# Patient Record
Sex: Male | Born: 1980 | Race: White | Hispanic: No | Marital: Married | Smoking: Former smoker
Health system: Southern US, Community
[De-identification: ages and names within clinical notes are randomized; demographics above are authoritative.]

## PROBLEM LIST (undated history)

## (undated) DIAGNOSIS — F909 Attention-deficit hyperactivity disorder, unspecified type: Secondary | ICD-10-CM

## (undated) HISTORY — DX: Attention-deficit hyperactivity disorder, unspecified type: F90.9

## (undated) HISTORY — PX: HERNIA REPAIR: SHX51

---

## 2006-09-05 ENCOUNTER — Ambulatory Visit: Payer: Self-pay | Admitting: Internal Medicine

## 2006-09-05 LAB — CONVERTED CEMR LAB
ALT: 21 units/L (ref 0–40)
AST: 25 units/L (ref 0–37)
Albumin: 4.1 g/dL (ref 3.5–5.2)
Alkaline Phosphatase: 47 units/L (ref 39–117)
BUN: 16 mg/dL (ref 6–23)
Basophils Absolute: 0.1 10*3/uL (ref 0.0–0.1)
Basophils Relative: 1 % (ref 0.0–1.0)
Bilirubin, Direct: 0.1 mg/dL (ref 0.0–0.3)
CO2: 31 meq/L (ref 19–32)
Calcium: 9 mg/dL (ref 8.4–10.5)
Chloride: 108 meq/L (ref 96–112)
Cholesterol: 131 mg/dL (ref 0–200)
Creatinine, Ser: 0.8 mg/dL (ref 0.4–1.5)
Eosinophils Absolute: 0.2 10*3/uL (ref 0.0–0.6)
Eosinophils Relative: 4 % (ref 0.0–5.0)
GFR calc Af Amer: 151 mL/min
GFR calc non Af Amer: 125 mL/min
Glucose, Bld: 74 mg/dL (ref 70–99)
HCT: 43.1 % (ref 39.0–52.0)
HDL: 44.5 mg/dL (ref >39.0)
Hemoglobin: 14.9 g/dL (ref 13.0–17.0)
LDL Cholesterol: 66 mg/dL (ref 0–99)
Lymphocytes Relative: 26.4 % (ref 12.0–46.0)
MCHC: 34.6 g/dL (ref 30.0–36.0)
MCV: 87.2 fL (ref 78.0–100.0)
Monocytes Absolute: 0.3 10*3/uL (ref 0.2–0.7)
Monocytes Relative: 5.8 % (ref 3.0–11.0)
Neutro Abs: 3.4 10*3/uL (ref 1.4–7.7)
Neutrophils Relative %: 62.8 % (ref 43.0–77.0)
Platelets: 225 10*3/uL (ref 150–400)
Potassium: 3.6 meq/L (ref 3.5–5.1)
RBC: 4.94 M/uL (ref 4.22–5.81)
RDW: 13.2 % (ref 11.5–14.6)
Sodium: 144 meq/L (ref 135–145)
TSH: 4.41 microintl units/mL (ref 0.35–5.50)
Total Bilirubin: 0.8 mg/dL (ref 0.3–1.2)
Total CHOL/HDL Ratio: 2.9
Total Protein: 6.8 g/dL (ref 6.0–8.3)
Triglycerides: 103 mg/dL (ref 0–149)
VLDL: 21 mg/dL (ref 0–40)
WBC: 5.5 10*3/uL (ref 4.5–10.5)

## 2007-06-21 ENCOUNTER — Encounter: Payer: Self-pay | Admitting: Internal Medicine

## 2007-08-23 ENCOUNTER — Ambulatory Visit: Payer: Self-pay | Admitting: Sports Medicine

## 2007-09-01 ENCOUNTER — Ambulatory Visit: Payer: Self-pay | Admitting: Sports Medicine

## 2007-10-17 ENCOUNTER — Telehealth (INDEPENDENT_AMBULATORY_CARE_PROVIDER_SITE_OTHER): Payer: Self-pay | Admitting: *Deleted

## 2008-05-07 ENCOUNTER — Ambulatory Visit: Payer: Self-pay | Admitting: Sports Medicine

## 2008-06-19 ENCOUNTER — Ambulatory Visit: Payer: Self-pay | Admitting: Sports Medicine

## 2008-08-14 ENCOUNTER — Encounter: Admission: RE | Admit: 2008-08-14 | Discharge: 2008-08-14 | Payer: Self-pay | Admitting: Family Medicine

## 2008-08-14 ENCOUNTER — Ambulatory Visit: Payer: Self-pay | Admitting: Sports Medicine

## 2008-08-15 ENCOUNTER — Encounter (INDEPENDENT_AMBULATORY_CARE_PROVIDER_SITE_OTHER): Payer: Self-pay | Admitting: *Deleted

## 2008-09-03 ENCOUNTER — Telehealth (INDEPENDENT_AMBULATORY_CARE_PROVIDER_SITE_OTHER): Payer: Self-pay | Admitting: *Deleted

## 2008-09-05 ENCOUNTER — Encounter: Payer: Self-pay | Admitting: Sports Medicine

## 2008-09-06 ENCOUNTER — Ambulatory Visit (HOSPITAL_COMMUNITY): Admission: RE | Admit: 2008-09-06 | Discharge: 2008-09-06 | Payer: Self-pay | Admitting: Family Medicine

## 2008-09-09 ENCOUNTER — Ambulatory Visit: Payer: Self-pay | Admitting: Family Medicine

## 2008-09-09 ENCOUNTER — Telehealth: Payer: Self-pay | Admitting: Sports Medicine

## 2008-09-10 ENCOUNTER — Encounter: Payer: Self-pay | Admitting: Family Medicine

## 2008-09-12 LAB — CONVERTED CEMR LAB
ALT: 22 units/L (ref 0–53)
AST: 26 units/L (ref 0–37)
Albumin: 4.1 g/dL (ref 3.5–5.2)
Alkaline Phosphatase: 33 units/L — ABNORMAL LOW (ref 39–117)
BUN: 15 mg/dL (ref 6–23)
Basophils Absolute: 0 10*3/uL (ref 0.0–0.1)
Basophils Relative: 0.3 % (ref 0.0–3.0)
Bilirubin, Direct: 0.1 mg/dL (ref 0.0–0.3)
CO2: 30 meq/L (ref 19–32)
Calcium: 9.6 mg/dL (ref 8.4–10.5)
Chloride: 103 meq/L (ref 96–112)
Creatinine, Ser: 1.1 mg/dL (ref 0.4–1.5)
EBV VCA IgG: 6.38 — ABNORMAL HIGH
EBV VCA IgM: 0.09
Eosinophils Absolute: 0.3 10*3/uL (ref 0.0–0.7)
Eosinophils Relative: 1.8 % (ref 0.0–5.0)
GFR calc non Af Amer: 84.86 mL/min (ref >60)
Glucose, Bld: 87 mg/dL (ref 70–99)
HCT: 42.1 % (ref 39.0–52.0)
Hemoglobin: 14.4 g/dL (ref 13.0–17.0)
Lymphocytes Relative: 12.7 % (ref 12.0–46.0)
Lymphs Abs: 1.8 10*3/uL (ref 0.7–4.0)
MCHC: 34.3 g/dL (ref 30.0–36.0)
MCV: 89.5 fL (ref 78.0–100.0)
Monocytes Absolute: 0.4 10*3/uL (ref 0.1–1.0)
Monocytes Relative: 3 % (ref 3.0–12.0)
Neutro Abs: 11.5 10*3/uL — ABNORMAL HIGH (ref 1.4–7.7)
Neutrophils Relative %: 82.2 % — ABNORMAL HIGH (ref 43.0–77.0)
Platelets: 228 10*3/uL (ref 150.0–400.0)
Potassium: 4.4 meq/L (ref 3.5–5.1)
RBC: 4.7 M/uL (ref 4.22–5.81)
RDW: 12.8 % (ref 11.5–14.6)
Sodium: 141 meq/L (ref 135–145)
TSH: 2.49 microintl units/mL (ref 0.35–5.50)
Total Bilirubin: 0.9 mg/dL (ref 0.3–1.2)
Total Protein: 7.6 g/dL (ref 6.0–8.3)
WBC: 14 10*3/uL — ABNORMAL HIGH (ref 4.5–10.5)

## 2008-10-21 ENCOUNTER — Ambulatory Visit: Payer: Self-pay | Admitting: Internal Medicine

## 2008-10-23 LAB — CONVERTED CEMR LAB
ALT: 29 units/L (ref 0–53)
BUN: 18 mg/dL (ref 6–23)
CO2: 32 meq/L (ref 19–32)
Chloride: 107 meq/L (ref 96–112)
Cholesterol: 136 mg/dL (ref 0–200)
Eosinophils Relative: 5.9 % — ABNORMAL HIGH (ref 0.0–5.0)
Glucose, Bld: 93 mg/dL (ref 70–99)
HCT: 42.4 % (ref 39.0–52.0)
Lymphs Abs: 1.6 10*3/uL (ref 0.7–4.0)
MCV: 89.1 fL (ref 78.0–100.0)
Monocytes Absolute: 0.2 10*3/uL (ref 0.1–1.0)
Platelets: 176 10*3/uL (ref 150.0–400.0)
Potassium: 4.1 meq/L (ref 3.5–5.1)
RDW: 12.9 % (ref 11.5–14.6)
TSH: 4.12 microintl units/mL (ref 0.35–5.50)
Total Bilirubin: 1.1 mg/dL (ref 0.3–1.2)
WBC: 4.2 10*3/uL — ABNORMAL LOW (ref 4.5–10.5)

## 2009-06-10 ENCOUNTER — Ambulatory Visit: Payer: Self-pay | Admitting: Internal Medicine

## 2009-06-10 DIAGNOSIS — F909 Attention-deficit hyperactivity disorder, unspecified type: Secondary | ICD-10-CM | POA: Insufficient documentation

## 2009-07-01 ENCOUNTER — Ambulatory Visit: Payer: Self-pay | Admitting: Psychology

## 2009-07-01 ENCOUNTER — Telehealth: Payer: Self-pay | Admitting: Internal Medicine

## 2009-07-03 ENCOUNTER — Encounter: Payer: Self-pay | Admitting: Internal Medicine

## 2009-07-17 ENCOUNTER — Ambulatory Visit: Payer: Self-pay | Admitting: Psychology

## 2009-07-28 ENCOUNTER — Ambulatory Visit: Payer: Self-pay | Admitting: Psychology

## 2009-07-29 ENCOUNTER — Ambulatory Visit: Payer: Self-pay | Admitting: Internal Medicine

## 2009-08-26 ENCOUNTER — Ambulatory Visit: Payer: Self-pay | Admitting: Internal Medicine

## 2009-09-09 ENCOUNTER — Ambulatory Visit: Payer: Self-pay | Admitting: Internal Medicine

## 2009-09-10 LAB — CONVERTED CEMR LAB
Eosinophils Relative: 3.8 % (ref 0.0–5.0)
H Pylori IgG: NEGATIVE
HCT: 40.8 % (ref 39.0–52.0)
Lymphocytes Relative: 33.4 % (ref 12.0–46.0)
Monocytes Relative: 4.6 % (ref 3.0–12.0)
Neutrophils Relative %: 57.6 % (ref 43.0–77.0)
Platelets: 208 10*3/uL (ref 150.0–400.0)
WBC: 5.2 10*3/uL (ref 4.5–10.5)

## 2009-11-07 ENCOUNTER — Ambulatory Visit: Payer: Self-pay | Admitting: Family Medicine

## 2009-11-07 DIAGNOSIS — E663 Overweight: Secondary | ICD-10-CM | POA: Insufficient documentation

## 2009-11-21 ENCOUNTER — Ambulatory Visit: Payer: Self-pay | Admitting: Internal Medicine

## 2009-11-21 ENCOUNTER — Ambulatory Visit: Payer: Self-pay | Admitting: Family Medicine

## 2009-11-21 DIAGNOSIS — K219 Gastro-esophageal reflux disease without esophagitis: Secondary | ICD-10-CM

## 2009-12-22 ENCOUNTER — Ambulatory Visit: Payer: Self-pay | Admitting: Family Medicine

## 2009-12-22 DIAGNOSIS — F509 Eating disorder, unspecified: Secondary | ICD-10-CM | POA: Insufficient documentation

## 2009-12-24 ENCOUNTER — Ambulatory Visit: Payer: Self-pay | Admitting: Family Medicine

## 2009-12-25 ENCOUNTER — Telehealth: Payer: Self-pay | Admitting: Family Medicine

## 2009-12-25 LAB — CONVERTED CEMR LAB
Basophils Relative: 0.5 % (ref 0.0–3.0)
CO2: 32 meq/L (ref 19–32)
Chloride: 99 meq/L (ref 96–112)
Eosinophils Absolute: 0.1 10*3/uL (ref 0.0–0.7)
Hemoglobin: 15.3 g/dL (ref 13.0–17.0)
MCHC: 34.3 g/dL (ref 30.0–36.0)
MCV: 90.1 fL (ref 78.0–100.0)
Monocytes Absolute: 0.4 10*3/uL (ref 0.1–1.0)
Neutro Abs: 4.8 10*3/uL (ref 1.4–7.7)
Potassium: 4.3 meq/L (ref 3.5–5.1)
RBC: 4.95 M/uL (ref 4.22–5.81)
Sodium: 136 meq/L (ref 135–145)

## 2010-01-16 ENCOUNTER — Encounter: Payer: Self-pay | Admitting: Family Medicine

## 2010-02-13 ENCOUNTER — Encounter: Payer: Self-pay | Admitting: Family Medicine

## 2010-03-17 ENCOUNTER — Ambulatory Visit: Payer: Self-pay | Admitting: Internal Medicine

## 2010-04-15 ENCOUNTER — Encounter: Payer: Self-pay | Admitting: Family Medicine

## 2010-04-24 ENCOUNTER — Encounter: Payer: Self-pay | Admitting: Family Medicine

## 2010-06-17 ENCOUNTER — Telehealth: Payer: Self-pay | Admitting: Internal Medicine

## 2010-07-07 NOTE — Assessment & Plan Note (Signed)
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  Hessie Diener has a hx of binge eating; still identifies self as an Scientist, physiological; attends [employer] 2 X wk.  Pt was obese ages [age].  Usual eating pattern includes 3 meals & 1 snack per day.  Usual intake is  ~same daily, including  ~4 X 16 oz of coffee.  Hessie Diener weighs & measures all foods. Avoided foods include macademia nuts (allergy) & carrots & celery (dislike).  Supplements include quercetin sporadically,  ~6 g fish oil (CVD prevention & ADHD), vit D3 2000 International Units 1-2 X day.  He also takes Tums  ~3 X day (was up to 12 some days when doing NSAIDs) & renitidine, started recently.  Reflux began in Jan when taking a lot of NSAIDs.  Usual exercise includes running, but not  much lately b/c of school & work.  He ran his first marathon in 2009.  He is now running  ~20 min only 1 X wk.  24-hr recall suggests intake of  ~3400 kcal, which was  ~63% carb, 15% protein, & 22% fat: B (7 AM)- 5 oz pineapple, 5 oz banana, 5 oz frozen berries, 4 oz f-f [place] yogurt, 2 c oatmeal; L (11 AM)- salad w/ >1 c beans, 2 T parmesan, 1/4 c mozzarella, 1 oz olive oil, 1 c quinoa & millet, 6 oz beans, 3 oz fish, ditto bkfast fruit; Snk (5 PM)- 1 oz Cheerios, 1 oz raw almonds, 2 oz raisins; D (9 PM)- 1 c whole grain, 2 c raw veg's, 1 c beans, 3 oz firm tofu, 2 T margarine. Yesterday was atypical; dinner is usually larger &  ~4:30 PM.   Nutrition Diagnosis:  Disordered eating pattern (NB-1.5) related to binge eating as evidenced by reported need for extreme control to stay on track.  H/o overwt/obesity ([place]-3.3) evidenced by self-report.  Imbalance of macronutrients (NI-5.5) evidenced by yesterday's intake of 63% carb, 22% fat, and 15% protein.  Physical inactivity  (NB-2.1) related to time constraints evidenced by only workout 1 X wk.   Intervention:  Hessie Diener is doing well w/ current food plan, but would benefit from more variety.  Protein intake is adequate; carb a bit high while not running.  (See Pt Instrxns.)    Monitoring/Eval:  F/U per pt.     Allergies: No Known Drug Allergies   Complete Medication List: 1)  Adderall Xr 10 Mg Xr24h-cap (Amphetamine-dextroamphetamine) .... Take 1 tablet by mouth two times a day 2)  Voltaren 1 % Gel (Diclofenac sodium) .... Apply 2 grams to affected area qid  Other Orders: Inital Assessment Each - [facility] (56213)  Patient Instructions: 1)  Start your day with water, i.e., 8-16 oz.   2)  Reduce coffee intake to 16 oz X 2 per day.   3)  Recommended Dietary Allowance for protein: 0.8 gram per kg body wt.  85 kg X 0.8 g = 68 g protein per day.  ATHLETES probably need more, 1-1.3 g per kg body weight:  85 - 111 g/day.   4)  Continue use of high quality fats (olive and canola oils, unsalted nuts and seeds), and even increase as desired.  (  Yesterday's intake was low in fat relative to carb and protein.) 5)  Reminder:  Hard to eat too many fruits and veg's.   6)  Call Dr. Gerilyn Pilgrim if any Qs:  [phone].

## 2010-07-07 NOTE — Assessment & Plan Note (Signed)
Summary: FU HIP PAIN/MJD   History of Present Illness: Pt returns for follow-up of right sided lower back pain that radiates to his right hip as well as popping in his right hip. He does feel a little better since starting to use the lift in his right shoe while at work. He mainly has the right sided lower back pain when he is at work or after working. He feels comfortable running and denies any pain at that time. Pain is focal over his mid iliac crest and radiates around to his right hip. No significant right knee pain at this time unless he has been standing for long periods of time. Has been doing multiple stretches and exercises and is also trying to slowly change his running form so that he is not as upright and tense when he is running. Continues to run barefoot. Hip rotation exercises though cause hip popping and pain located deep in his groin. No prior injuries to his back or hip.   Allergies: No Known Drug Allergies  Past History:  Past Medical History: Last updated: 09/05/2006 ADD Hx of obesity- highest weight 240lbs  Past Surgical History: Last updated: 09/05/2006 Inguinal herniorrhaphy  Family History: Last updated: 09/05/2006 Father-ht, lipids, recovering alcohol Mother-overweight, htn, lipids Family History of Alcoholism/Addiction Family History High cholesterol Family History Hypertension  Social History: Last updated: 10/21/2008 Occupation: [employer] Married one child Never Smoked Alcohol use-yes Regular exercise-yes (recent hiatus due to recent illness)  Risk Factors: Alcohol Use: <1 (11/21/2009) Exercise: yes (10/21/2008)  Risk Factors: Smoking Status: never (11/21/2009)  Physical Exam  General:  alert and well-developed.   Head:  normocephalic and atraumatic.   Ears:  Normal hearing Mouth:  MMM Neck:  supple and full ROM.   Lungs:  normal respiratory effort.   Msk:  Back: No scoliosis No TTP along the cervical, thoracic or lumbar  spine + TTP over focal site above mid right iliac crest recreating his pain Full ROM at the hip with mild pain with all ROM over focal area just above his mid iliac crest on the right Able to walk on heels and toes 5/5 strength with resisted knee flexion and extension and resisted big toe extension Neg SLR bilaterally 2+ DTRs of bilateral patellar tendons  Hips: No TTP over trochanteric bursas or groin Normal IR and ER of bilateral hips without resistance or crepitus Mildly positive FABER testing bilaterally 5/5 strength with resisted hip flexion and abduction bilaterally Mild pop with flexion and then extension of right hip causing pain Neurovascularly intact  Right Knee No swelling or bony abnormalities Normal ROM but pain laterally with extreme flexion + TTP over ITB at Gerdy's tubercle. No joint line TTP. Normal MCL, LCL, ACL, and PCL with special testing 5/5 strength with resisted knee flexion and extension Neg McMurray's  Left Knee: No swelling or bony abnormalities Full ROM  5/5 strength throughout No TTP throughout Neg special testing    Impression & Recommendations:  Problem # 1:  BACK PAIN, LUMBAR (ICD-724.2) 1. Will refer to Posey Pronto at [facility] for back and hip stretching and strengthening. He may mainly just need modalities to help with his pain symtpoms 2. Can use an OTC NSAID as needed for pain relief 3. Can use heat over his back for 15 minutes followed by ice for 15 minutes fo pain relief 4. Run as tolerated 5. Continue to relax running form with arms lower 6. Spent over half of the 40 minutes couseling the patient  face to face about injuries and treatment plan  Orders: Physical Therapy Referral (PT)  Problem # 2:  HIP PAIN, RIGHT (ICD-719.45) 1. Would consider x-ray in the future to rule out a pincher or CAM hip socket 2. May be a classic snapping hip but it does cause him pain 3. PT  Orders: Physical Therapy Referral (PT)  Problem # 3:   KNEE PAIN, RIGHT (ICD-719.46) 1. Improved since last visit but still having ITB problems - consider ultrasound at PT 2. Do not keep knee flexed at 90 degrees when he is using the foam roller to stretch out his ITB 3. PT  Orders: Physical Therapy Referral (PT)  Complete Medication List: 1)  Adderall Xr 10 Mg Xr24h-cap (Amphetamine-dextroamphetamine) .... Take 1 tablet by mouth two times a day Physical Therapy Referral (PT)   Impression & Recommendations:  Orders: Physical Therapy Referral (PT)   Orders: Physical Therapy Referral (PT)   Orders: Physical Therapy Referral (PT)   Complete Medication List: 1)  Adderall Xr 10 Mg Xr24h-cap (Amphetamine-dextroamphetamine) .... Take 1 tablet by mouth two times a day

## 2010-07-07 NOTE — Miscellaneous (Signed)
Summary: [facility]  [facility]   Imported By: Marily Memos 02/17/2010 09:13:56  _____________________________________________________________________  External Attachment:    Type:   Image     Comment:   External Document

## 2010-07-07 NOTE — Miscellaneous (Signed)
[facility]   Imported By: Marily Memos 02/11/2010 10:13:56  _____________________________________________________________________  External Attachment:    Type:   Image     Comment:   External Document

## 2010-07-07 NOTE — Assessment & Plan Note (Signed)
Summary: 1 month rov/njr   Vital Signs:  Patient profile:   30 year old male Weight:      188 pounds BMI:     24.89 Temp:     98.2 degrees F Pulse rate:   68 / minute Resp:     12 per minute BP sitting:   120 / 78  (left arm)  Vitals Entered By: Gladis Riffle, RN (July 29, 2009 9:54 AM) CC: 1 month rov, states adderall is working Is Patient Diabetic? No   CC:  1 month rov and states adderall is working.  History of Present Illness: ADHD-- started meds--feels better ---says 70% of sxs resolved no bad side effects still distracted at times  All other systems reviewed and were negative   Preventive Screening-Counseling & Management  Alcohol-Tobacco     Alcohol drinks/day: <1     Smoking Status: never  Current Problems (verified): 1)  Adhd  (ICD-314.01) 2)  Preventive Health Care  (ICD-V70.0)  Current Medications (verified): 1)  Adderall Xr 10 Mg Xr24h-Cap (Amphetamine-Dextroamphetamine) .Marland Kitchen.. 1 By Mouth Daily  Allergies (verified): No Known Drug Allergies  Past History:  Past Medical History: Last updated: 09/05/2006 ADD Hx of obesity- highest weight 240lbs  Past Surgical History: Last updated: 09/05/2006 Inguinal herniorrhaphy  Family History: Last updated: 09/05/2006 Father-ht, lipids, recovering alcohol Mother-overweight, htn, lipids Family History of Alcoholism/Addiction Family History High cholesterol Family History Hypertension  Social History: Last updated: 10/21/2008 Occupation: [employer] Married one child Never Smoked Alcohol use-yes Regular exercise-yes (recent hiatus due to recent illness)  Risk Factors: Alcohol Use: <1 (07/29/2009) Exercise: yes (10/21/2008)  Risk Factors: Smoking Status: never (07/29/2009)  Physical Exam  General:  Well-developed,well-nourished,in no acute distress; alert,appropriate and cooperative throughout examination Head:  normocephalic and atraumatic.   Psych:  normally interactive and good eye  contact.     Impression & Recommendations:  Problem # 1:  ADHD (ICD-314.01) discussed-- has sxs at night when the meds "wear off" will try second dose in early afternoon side effects discussed if increase meds work we will provide 90 day worth of Rx next month (no visit necessary)  Complete Medication List: 1)  Adderall Xr 10 Mg Xr24h-cap (Amphetamine-dextroamphetamine) .... Take 1 tablet by mouth two times a day Prescriptions: ADDERALL XR 10 MG XR24H-CAP (AMPHETAMINE-DEXTROAMPHETAMINE) Take 1 tablet by mouth two times a day  #60 x 0   Entered and Authorized by:   Birdie Sons MD   Signed by:   Birdie Sons MD on 07/29/2009   Method used:   Print then Give to Patient   RxID:   [id number]

## 2010-07-07 NOTE — Progress Notes (Signed)
Summary: ADD treatment  Phone Note Call from Patient   Caller: Patient Call For: Birdie Sons MD Summary of Call: Pt. was told by pyschology to call Dr. Cato Mulligan and see how to progress for his ADD treatment. [phone] Initial call taken by: Lynann Beaver CMA,  July 01, 2009 3:38 PM  Follow-up for Phone Call        ok . see meds schedule OV in one month. get records from psychology Follow-up by: Birdie Sons MD,  July 01, 2009 7:08 PM    New/Updated Medications: ADDERALL XR 10 MG XR24H-CAP (AMPHETAMINE-DEXTROAMPHETAMINE) 1 by mouth daily Prescriptions: ADDERALL XR 10 MG XR24H-CAP (AMPHETAMINE-DEXTROAMPHETAMINE) 1 by mouth daily  #30 x 0   Entered by:   Lynann Beaver CMA   Authorized by:   Birdie Sons MD   Signed by:   Lynann Beaver CMA on 07/02/2009   Method used:   Print then Give to Patient   RxID:   [id number] ADDERALL XR 10 MG XR24H-CAP (AMPHETAMINE-DEXTROAMPHETAMINE) 1 by mouth daily  #30 x 0   Entered and Authorized by:   Birdie Sons MD   Signed by:   Birdie Sons MD on 07/01/2009   Method used:   Telephoned to ...         RxID:   [id number]  Left message to have pt pick up script, and make a one month appt.  He has signed release of records from pyschology.

## 2010-07-07 NOTE — Assessment & Plan Note (Signed)
Summary: 1 month rov/fu on meds/njr   Vital Signs:  Patient profile:   30 year old male Weight:      189 pounds Temp:     98.5 degrees F oral BP sitting:   128 / 84  (left arm) Cuff size:   regular  Vitals Entered By: Kern Reap CMA Duncan Dull) (August 26, 2009 9:57 AM) CC: follow-up visit Is Patient Diabetic? No   CC:  follow-up visit.  History of Present Illness: ADHD sxs much better on current dose (adderall two times a day) no bad side effects.   All other systems reviewed and were negative   Current Problems (verified): 1)  Adhd  (ICD-314.01) 2)  Preventive Health Care  (ICD-V70.0)  Allergies: No Known Drug Allergies  Past History:  Past Medical History: Last updated: 09/05/2006 ADD Hx of obesity- highest weight 240lbs  Past Surgical History: Last updated: 09/05/2006 Inguinal herniorrhaphy  Family History: Last updated: 09/05/2006 Father-ht, lipids, recovering alcohol Mother-overweight, htn, lipids Family History of Alcoholism/Addiction Family History High cholesterol Family History Hypertension  Social History: Last updated: 10/21/2008 Occupation: [employer] Married one child Never Smoked Alcohol use-yes Regular exercise-yes (recent hiatus due to recent illness)  Risk Factors: Alcohol Use: <1 (07/29/2009) Exercise: yes (10/21/2008)  Risk Factors: Smoking Status: never (07/29/2009)  Physical Exam  General:  Well-developed,well-nourished,in no acute distress; alert,appropriate and cooperative throughout examination   Impression & Recommendations:  Problem # 1:  ADHD (ICD-314.01) continue current medications  he is tolerating them well without side effects.  Complete Medication List: 1)  Adderall Xr 10 Mg Xr24h-cap (Amphetamine-dextroamphetamine) .... Take 1 tablet by mouth two times a day  Patient Instructions: 1)  Please schedule a follow-up appointment in 3 months. Prescriptions: ADDERALL XR 10 MG XR24H-CAP  (AMPHETAMINE-DEXTROAMPHETAMINE) Take 1 tablet by mouth two times a day  #60 x 0   Entered and Authorized by:   Birdie Sons MD   Signed by:   Birdie Sons MD on 08/26/2009   Method used:   Print then Give to Patient   RxID:   [id number] ADDERALL XR 10 MG XR24H-CAP (AMPHETAMINE-DEXTROAMPHETAMINE) Take 1 tablet by mouth two times a day  #60 x 0   Entered and Authorized by:   Birdie Sons MD   Signed by:   Birdie Sons MD on 08/26/2009   Method used:   Print then Give to Patient   RxID:   [id number] ADDERALL XR 10 MG XR24H-CAP (AMPHETAMINE-DEXTROAMPHETAMINE) Take 1 tablet by mouth two times a day  #60 x 0   Entered and Authorized by:   Birdie Sons MD   Signed by:   Birdie Sons MD on 08/26/2009   Method used:   Print then Give to Patient   RxID:   [id number]

## 2010-07-07 NOTE — Assessment & Plan Note (Signed)
Summary: MED CK // RS   Vital Signs:  Patient profile:   30 year old male Weight:      188 pounds Temp:     98.4 degrees F oral BP sitting:   110 / 78  (left arm) Cuff size:   regular  Vitals Entered By: Kern Reap CMA Duncan Dull) (June 10, 2009 8:34 AM)  Reason for Visit medication for ADD  History of Present Illness: he has wondered about ADHD has gone back to school and has done very well. Has used natural supplements.  has maintained weight loss He takes a fair amount of fish oil (up to 10 grams daily). has noted some GERD as a result  All other systems reviewed and were negative    Current Problems (verified): 1)  Preventive Health Care  (ICD-V70.0)  Current Medications (verified): 1)  Daypro 600 Mg Tabs (Oxaprozin) .... One By Mouth Bid 2)  Voltaren 1 % Gel (Diclofenac Sodium) .... Apply Qid  Allergies (verified): No Known Drug Allergies  Past History:  Past Medical History: Last updated: 09/05/2006 ADD Hx of obesity- highest weight 240lbs  Past Surgical History: Last updated: 09/05/2006 Inguinal herniorrhaphy  Family History: Last updated: 09/05/2006 Father-ht, lipids, recovering alcohol Mother-overweight, htn, lipids Family History of Alcoholism/Addiction Family History High cholesterol Family History Hypertension  Social History: Last updated: 10/21/2008 Occupation: [employer] Married one child Never Smoked Alcohol use-yes Regular exercise-yes (recent hiatus due to recent illness)  Risk Factors: Alcohol Use: <1 (09/05/2006) Exercise: yes (10/21/2008)  Risk Factors: Smoking Status: never (09/05/2006)  Review of Systems       All other systems reviewed and were negative   Physical Exam  General:  Well-developed,well-nourished,in no acute distress; alert,appropriate and cooperative throughout examination Head:  normocephalic and atraumatic.   Eyes:  pupils equal and pupils round.   Ears:  R ear normal and L ear normal.     Neck:  No deformities, masses, or tenderness noted. Chest Wall:  No deformities, masses, tenderness or gynecomastia noted.   Impression & Recommendations:  Problem # 1:  ? of ADHD (ICD-314.01)  sxs are some better with fish oil  Orders: Psychology Referral (Psychology)  Complete Medication List: 1)  Daypro 600 Mg Tabs (Oxaprozin) .... One by mouth bid 2)  Voltaren 1 % Gel (Diclofenac sodium) .... Apply qid

## 2010-07-07 NOTE — Medication Information (Signed)
Summary: Approval for Amphetamine-Dextropamhetamine/[facility]  Approval for Amphetamine-Dextropamhetamine/[facility]   Imported By: Maryln Gottron 07/07/2009 15:18:49  _____________________________________________________________________  External Attachment:    Type:   Image     Comment:   External Document

## 2010-07-07 NOTE — Assessment & Plan Note (Signed)
Summary: HIP FLEXOR PAIN,[facility]   Vital Signs:  Patient profile:   30 year old male Height:      73 inches Weight:      185 pounds BMI:     24.50 BP sitting:   142 / 84  Vitals Entered By: Lillia Pauls CMA (November 07, 2009 10:24 AM)  History of Present Illness: 18 months of worsening pain in Right hip flexor, Right knee. Has seen Chiropracter for hip and low back with some resolution.   Hip pain is lateral and into hi gluteus muscle. Hurts usually afyer he runs although sometimes at end of run. Pain is 4/20, aching.  Does not radiate down back of leg. No leg wekaness or giving way, no bowel or bladder incontinence. Hip feels like it "pops".  Knee pain on right, medial side of knee, 3-4/10, worse after he runs. Sometimes knee pops during flexion, but never gives way, never swells, no warmth or redness.  Runs 3-7 miles a day, 4-6 days a week. Trails when he can, streets other times. Runs barefoot.  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 Also going to [employer] for pre-PT school. Married, child Marvis Moeller is with him today.  Allergies: No Known Drug Allergies  Past History:  Past Medical History: Last updated: 09/05/2006 ADD Hx of obesity- highest weight 240lbs  Past Surgical History: Last updated: 09/05/2006 Inguinal herniorrhaphy  Family History: Last updated: 09/05/2006 Father-ht, lipids, recovering alcohol Mother-overweight, htn, lipids Family History of Alcoholism/Addiction Family History High cholesterol Family History Hypertension  Social History: Last updated: 10/21/2008 Occupation: [employer] Married one child Never Smoked Alcohol use-yes Regular exercise-yes (recent hiatus due to recent illness)  Review of Systems       see hpi  Physical Exam  General:  alert, well-developed, and well-nourished.   Msk:  Normal muscle strength that is symetrical except slightly weaker in right sided hip adductors c/w left.   Leg length discrepancyLeft is  0.5 cm longer than right (true)  Feet without lesion or excessive callous.  GAIT (running) Very upright upper body posture with extremely high arm carry (un-natural apperaing). Fore59foot striker (barefoot). His right leg swings a few inches across midline and he has a bit of cicumduction right foot during beginning of swing phase.  PELVIS: no trendelenburg butthe right PSIS is about 0.5 -1 cm lower than the left. SI joints nin tender   Impression & Recommendations:  Problem # 1:  KNEE PAIN, RIGHT (ICD-719.46)  Problem # 2:  HIP PAIN, RIGHT (ICD-719.45) Long discussion rearding his form. I think because he runs barefoot he i taking pretty short stride length. Combiningthis with the rather excessive upright posture and his mild right leg crossover, he is torking the right hip and knee. We discussed proper form--he says he "allows himself to run natually one day a week". I would encourage lower more natural arm carry, line drills. Some right adductor muscle strengthening. Also a heel lift for shoes when standing. rtc if this does not improve his issues  Problem # 3:  Hx of OVERWEIGHT (ICD-278.02) max weight at one time 240. He had some questions about nitrition. I gave him name of nutritionist  Complete Medication List: 1)  Adderall Xr 10 Mg Xr24h-cap (Amphetamine-dextroamphetamine) .... Take 1 tablet by mouth two times a day

## 2010-07-07 NOTE — Medication Information (Signed)
Summary: Prior Authorization Request for Amphetamine-Dextroamphetamine  Prior Authorization Request for Amphetamine-Dextroamphetamine   Imported By: Maryln Gottron 07/07/2009 14:53:40  _____________________________________________________________________  External Attachment:    Type:   Image     Comment:   External Document

## 2010-07-07 NOTE — Progress Notes (Signed)
Summary: light headed, headache  Phone Note Call from Patient Call back at [phone]   Caller: Patient Reason for Call: Talk to Doctor Summary of Call: patient is calling because he did receive his lab resutls.  However he states that he is still very light headed with a headache.  He was in bed from 4pm-6am.  Any suggestions? Initial call taken by: Kern Reap CMA Duncan Dull),  December 25, 2009 5:00 PM  Follow-up for Phone Call        I suggest that he drink plenty of fluids and get more sleep.  By hx, very inadequate amounts of sleep which could be a big part of his fatigue. Follow-up by: Evelena Peat MD,  December 25, 2009 5:38 PM  Additional Follow-up for Phone Call Additional follow up Details #1::        left message on machine for patient  Additional Follow-up by: Kern Reap CMA Duncan Dull),  December 25, 2009 5:48 PM

## 2010-07-07 NOTE — Letter (Signed)
Summary: PT referral   PT referral   Imported By: Marily Memos 04/15/2010 15:51:08  _____________________________________________________________________  External Attachment:    Type:   Image     Comment:   External Document

## 2010-07-07 NOTE — Assessment & Plan Note (Signed)
Summary: gi issues//ccm   Vital Signs:  Patient profile:   30 year old male Weight:      184 pounds Temp:     97.4 degrees F oral BP sitting:   120 / 90  (left arm) Cuff size:   regular  Vitals Entered By: Kathrynn Speed CMA (December 24, 2009 1:57 PM) CC: Gi issues, tired weak, dizzy, no want to eat, src   History of Present Illness: Patient seen initially with complaints of GI issues but on further questioning his main concerns are feeling more fatigued over the past several weeks. He has a very busy schedule with work, school, and being a father. Very little sleep at night but no recent change in schedule.  He had history of some midepigastric discomfort which improved with Zantac several months ago. He has occasional heartburn symptoms after eating and has recently tried some Pepcid without much relief. Previous Helicobacter testing negative. No nonsteroidal use. Occasional alcohol use. Nonsmoker. No history of peptic ulcer disease. Very regimented diet and very strict with regard to calorie and food intake. No history of bulimia.  Patient denies nausea, vomiting, diarrhea, or recent weight changes. No melena. has recently noticed an increased heart rate. He runs regularly and normally has a heart rate in the 50s or 60s. Has noticed recent elevated pulse around 100 and occasionally up as high as 125.  Denies depressive symptoms. Generally only gets a few hours sleep at night.  Current Medications (verified): 1)  Adderall Xr 10 Mg Xr24h-Cap (Amphetamine-Dextroamphetamine) .... Take 1 Tablet By Mouth Two Times A Day  Allergies (verified): No Known Drug Allergies  Past History:  Past Medical History: Last updated: 09/05/2006 ADD Hx of obesity- highest weight 240lbs  Past Surgical History: Last updated: 09/05/2006 Inguinal herniorrhaphy  Family History: Last updated: 09/05/2006 Father-ht, lipids, recovering alcohol Mother-overweight, htn, lipids Family History of  Alcoholism/Addiction Family History High cholesterol Family History Hypertension  Social History: Last updated: 10/21/2008 Occupation: [employer] Married one child Never Smoked Alcohol use-yes Regular exercise-yes (recent hiatus due to recent illness)  Risk Factors: Alcohol Use: <1 (11/21/2009) Exercise: yes (10/21/2008)  Risk Factors: Smoking Status: never (11/21/2009)  Review of Systems  The patient denies anorexia, weight loss, chest pain, syncope, dyspnea on exertion, prolonged cough, headaches, hemoptysis, melena, and hematochezia.    Physical Exam  General:  Well-developed,well-nourished,in no acute distress; alert,appropriate and cooperative throughout examination Head:  Normocephalic and atraumatic without obvious abnormalities. No apparent alopecia or balding. Mouth:  Oral mucosa and oropharynx without lesions or exudates.  Teeth in good repair. Neck:  No deformities, masses, or tenderness noted. Lungs:  Normal respiratory effort, chest expands symmetrically. Lungs are clear to auscultation, no crackles or wheezes. Heart:  Normal rate and regular rhythm. S1 and S2 normal without gallop, murmur, click, rub or other extra sounds. Abdomen:  Bowel sounds positive,abdomen soft and non-tender without masses, organomegaly or hernias noted. Neurologic:  alert & oriented X3, cranial nerves II-XII intact, and strength normal in all extremities.   Skin:  no rashes.   Cervical Nodes:  No lymphadenopathy noted   Impression & Recommendations:  Problem # 1:  FATIGUE (ICD-780.79) Probably related to inadequate sleep.  Obtain screening labs. Orders: Venipuncture (16109) Specimen Handling (60454) TLB-BMP (Basic Metabolic Panel-BMET) (80048-METABOL) TLB-CBC Platelet - w/Differential (85025-CBCD) TLB-TSH (Thyroid Stimulating Hormone) (84443-TSH)  Problem # 2:  TACHYCARDIA (ICD-785.0)  need to rule out hyperthyroidism though he does not have any weight loss or diarrhea  issues  Orders: Venipuncture (  42706) Specimen Handling (23762) TLB-TSH (Thyroid Stimulating Hormone) (84443-TSH)  Complete Medication List: 1)  Adderall Xr 10 Mg Xr24h-cap (Amphetamine-dextroamphetamine) .... Take 1 tablet by mouth two times a day  Patient Instructions: 1)  Try to get more sleep. 2)  continue exercise as tolerated

## 2010-07-07 NOTE — Assessment & Plan Note (Signed)
Summary: MED CHECK AND REFILLS/CJR   Vital Signs:  Patient profile:   30 year old male Weight:      186 pounds BMI:     24.63 Temp:     99 degrees F oral Pulse rhythm:   regular BP sitting:   120 / 76  Vitals Entered By: Lynann Beaver CMA (March 17, 2010 11:45 AM) CC: rov Is Patient Diabetic? No Pain Assessment Patient in pain? no        CC:  rov.  History of Present Illness: ADHD---doing well on  no significant side effects. He denies tachycardia.  Review of systems denies any chest pain, tachycardia.  Current Medications (verified): 1)  Adderall Xr 10 Mg Xr24h-Cap (Amphetamine-Dextroamphetamine) .... Take 1 Tablet By Mouth Two Times A Day  Allergies (verified): No Known Drug Allergies  Past History:  Past Medical History: Last updated: 09/05/2006 ADD Hx of obesity- highest weight 240lbs  Past Surgical History: Last updated: 09/05/2006 Inguinal herniorrhaphy  Family History: Last updated: 09/05/2006 Father-ht, lipids, recovering alcohol Mother-overweight, htn, lipids Family History of Alcoholism/Addiction Family History High cholesterol Family History Hypertension  Social History: Last updated: 10/21/2008 Occupation: [employer] Married one child Never Smoked Alcohol use-yes Regular exercise-yes (recent hiatus due to recent illness)  Risk Factors: Alcohol Use: <1 (11/21/2009) Exercise: yes (10/21/2008)  Risk Factors: Smoking Status: never (11/21/2009)  Physical Exam  General:  well-developed well-nourished male in no acute distress. Cardiac exam S1-S2 are regular. Abdominal exam thin, active bowel sounds, soft.   Impression & Recommendations:  Problem # 1:  ADHD (ICD-314.01) he is doing well on current medications. Prescriptions given for 3 months. He will call when he needs refills. We will provide for another 3 months I will see him in 6 months.  Complete Medication List: 1)  Adderall Xr 10 Mg Xr24h-cap  (Amphetamine-dextroamphetamine) .... Take 1 tablet by mouth two times a day  Other Orders: Hepatitis B Vaccine ADOLESCENT (2 dose) (16109) Admin 1st Vaccine (60454)  Patient Instructions: 1)  Please schedule a follow-up appointment in 6 months. Prescriptions: ADDERALL XR 10 MG XR24H-CAP (AMPHETAMINE-DEXTROAMPHETAMINE) Take 1 tablet by mouth two times a day  #60 x 0   Entered and Authorized by:   Birdie Sons MD   Signed by:   Birdie Sons MD on 03/17/2010   Method used:   Print then Give to Patient   RxID:   [id number] ADDERALL XR 10 MG XR24H-CAP (AMPHETAMINE-DEXTROAMPHETAMINE) Take 1 tablet by mouth two times a day  #60 x 0   Entered and Authorized by:   Birdie Sons MD   Signed by:   Birdie Sons MD on 03/17/2010   Method used:   Print then Give to Patient   RxID:   [id number] ADDERALL XR 10 MG XR24H-CAP (AMPHETAMINE-DEXTROAMPHETAMINE) Take 1 tablet by mouth two times a day  #60 x 0   Entered and Authorized by:   Birdie Sons MD   Signed by:   Birdie Sons MD on 03/17/2010   Method used:   Print then Give to Patient   RxID:   [id number]    Immunization History:  Hepatitis B Immunization History:    Hepatitis B # 1:  HepB Adult (02/13/2010)  Immunizations Administered:  Hepatitis B Vaccine # 2:    Vaccine Type: HepB Adolescent    Site: left deltoid    Mfr: Merck    Dose: 0.1 ml    Route: IM    Given by: Lynann Beaver CMA    Exp. Date:  10/05/2011    Lot #: [id number]    Immunization History:  Hepatitis B Immunization History:    Hepatitis B # 1:  hepb adult (02/13/2010)  Immunizations Administered:  Hepatitis B Vaccine # 2:    Vaccine Type: HepB Adolescent    Site: left deltoid    Mfr: Merck    Dose: 0.1 ml    Route: IM    Given by: Lynann Beaver CMA    Exp. Date: 10/05/2011    Lot #: [id number]

## 2010-07-07 NOTE — Assessment & Plan Note (Signed)
Summary: 3 prescriptions for adderall given   Vital Signs:  Patient profile:   30 year old male Weight:      186 pounds BMI:     24.63 Temp:     98.3 degrees F oral Pulse rate:   64 / minute Pulse rhythm:   regular Resp:     12 per minute BP sitting:   102 / 64  (left arm) Cuff size:   regular  Vitals Entered By: Gladis Riffle, RN (November 21, 2009 9:58 AM) CC: 3 month rov--medication seems to be working Is Patient Diabetic? No   CC:  3 month rov--medication seems to be working.  History of Present Illness: Feels well  reviewed [facility]  office note---has take some ibuprofen (minimal dosing). noticed that semed to cause GERD---uses TUMS  GERD typically happens at night (not lying down).   ADHD---doing well on meds  All other systems reviewed and were negative   Preventive Screening-Counseling & Management  Alcohol-Tobacco     Alcohol drinks/day: <1     Smoking Status: never  Current Problems (verified): 1)  Hx of Overweight  (ICD-278.02) 2)  Knee Pain, Right  (ICD-719.46) 3)  Hip Pain, Right  (ICD-719.45) 4)  Adhd  (ICD-314.01) 5)  Preventive Health Care  (ICD-V70.0)  Current Medications (verified): 1)  Adderall Xr 10 Mg Xr24h-Cap (Amphetamine-Dextroamphetamine) .... Take 1 Tablet By Mouth Two Times A Day  Allergies (verified): No Known Drug Allergies  Past History:  Past Medical History: Last updated: 09/05/2006 ADD Hx of obesity- highest weight 240lbs  Past Surgical History: Last updated: 09/05/2006 Inguinal herniorrhaphy  Family History: Last updated: 09/05/2006 Father-ht, lipids, recovering alcohol Mother-overweight, htn, lipids Family History of Alcoholism/Addiction Family History High cholesterol Family History Hypertension  Social History: Last updated: 10/21/2008 Occupation: [employer] Married one child Never Smoked Alcohol use-yes Regular exercise-yes (recent hiatus due to recent illness)  Risk Factors: Alcohol Use: <1  (11/21/2009) Exercise: yes (10/21/2008)  Risk Factors: Smoking Status: never (11/21/2009)  Physical Exam  General:  alert and well-developed.   Head:  normocephalic and atraumatic.   Neck:  No deformities, masses, or tenderness noted. Lungs:  normal respiratory effort and no intercostal retractions.   Abdomen:  soft and non-tender.  active BS   Impression & Recommendations:  Problem # 1:  ADHD (ICD-314.01) discussed continue current medications   Problem # 2:  GERD (ICD-530.81) has had recurrent GERD for 1-2 years sxs are minimal OTC H2 blocker for one month and then stop  Complete Medication List: 1)  Adderall Xr 10 Mg Xr24h-cap (Amphetamine-dextroamphetamine) .... Take 1 tablet by mouth two times a day Prescriptions: ADDERALL XR 10 MG XR24H-CAP (AMPHETAMINE-DEXTROAMPHETAMINE) Take 1 tablet by mouth two times a day  #60 x 0   Entered and Authorized by:   Birdie Sons MD   Signed by:   Birdie Sons MD on 11/21/2009   Method used:   Print then Give to Patient   RxID:   [id number] ADDERALL XR 10 MG XR24H-CAP (AMPHETAMINE-DEXTROAMPHETAMINE) Take 1 tablet by mouth two times a day  #60 x 0   Entered and Authorized by:   Birdie Sons MD   Signed by:   Birdie Sons MD on 11/21/2009   Method used:   Print then Give to Patient   RxID:   [id number] ADDERALL XR 10 MG XR24H-CAP (AMPHETAMINE-DEXTROAMPHETAMINE) Take 1 tablet by mouth two times a day  #60 x 0   Entered and Authorized by:   Birdie Sons MD  Signed by:   Birdie Sons MD on 11/21/2009   Method used:   Print then Give to Patient   RxID:   [id number]

## 2010-07-07 NOTE — Letter (Signed)
Summary: Out of Work  Adult nurse at [employer]  [address]   [place], [place] [address]   Phone: [phone]  Fax: [phone]    December 24, 2009   Employee:  JOSHUS ROGAN       To Whom It May Concern:   For Medical reasons, please excuse the above named employee from work for the following dates:  Start:   Office Visit today, 12/24/2009  End:   12/24/2009  If you need additional information, please feel free to contact our office.         Sincerely,       Evelena Peat, MD

## 2010-07-07 NOTE — Assessment & Plan Note (Signed)
Summary: ?ULCER/CJR   Vital Signs:  Patient profile:   31 year old male Pulse rate:   56 / minute Pulse rhythm:   regular Resp:     12 per minute BP sitting:   118 / 78  (left arm) Cuff size:   regular  Vitals Entered By: Gladis Riffle, RN (September 09, 2009 11:50 AM) CC: GI sxs Is Patient Diabetic? No   CC:  GI sxs.  History of Present Illness: Pt concerned with GI symptoms  one week ago he went for a run 4 hours after eating and noted a lot of esophageal burning. Also felt lightheaded and slightly dizzy. Sxs slowly resolved but was left with a gurgling stomach. Appetite dminished over the next day. (he did take some nsaids over 2-3 days) He had similar sxs in January (was taking daypro at that time) All other systems reviewed and were negative   Preventive Screening-Counseling & Management  Alcohol-Tobacco     Alcohol drinks/day: <1     Smoking Status: never  Current Problems (verified): 1)  Adhd  (ICD-314.01) 2)  Preventive Health Care  (ICD-V70.0)  Current Medications (verified): 1)  Adderall Xr 10 Mg Xr24h-Cap (Amphetamine-Dextroamphetamine) .... Take 1 Tablet By Mouth Two Times A Day  Allergies (verified): No Known Drug Allergies  Review of Systems       All other systems reviewed and were negative   Physical Exam  General:  alert and well-developed.   Head:  normocephalic and atraumatic.   Eyes:  pupils equal and pupils round.   Neck:  No deformities, masses, or tenderness noted. Chest Wall:  No deformities, masses, tenderness or gynecomastia noted. Lungs:  Normal respiratory effort, chest expands symmetrically. Lungs are clear to auscultation, no crackles or wheezes. Heart:  normal rate and regular rhythm.   Abdomen:  soft and non-tender.  active BS Msk:  No deformity or scoliosis noted of thoracic or lumbar spine.   Neurologic:  cranial nerves II-XII intact and strength normal in all extremities.   Skin:  turgor normal and color normal.     Impression &  Recommendations:  Problem # 1:  ABDOMINAL PAIN (ICD-789.00)  ? cause could be acid related  Orders: Venipuncture (16109) TLB-CBC Platelet - w/Differential (85025-CBCD) TLB-H. Pylori Abs(Helicobacter Pylori) (86677-HELICO)  Complete Medication List: 1)  Adderall Xr 10 Mg Xr24h-cap (Amphetamine-dextroamphetamine) .... Take 1 tablet by mouth two times a day

## 2010-07-09 NOTE — Progress Notes (Signed)
Summary: REFILL  Phone Note Refill Request Call back at Home Phone [phone] Message from:  Patient-live call  Refills Requested: Medication #1:  ADDERALL XR 10 MG XR24H-CAP Take 1 tablet by mouth two times a day. **PT. WANTS TO PICK UP PRESCRIPTION**  Initial call taken by: Georgian Co,  June 17, 2010 10:57 AM  Follow-up for Phone Call        rx up front ready, l/m on pts cell phone Follow-up by: Alfred Levins, CMA,  June 17, 2010 1:06 PM    Prescriptions: ADDERALL XR 10 MG XR24H-CAP (AMPHETAMINE-DEXTROAMPHETAMINE) Take 1 tablet by mouth two times a day  #60 x 0   Entered by:   Alfred Levins, CMA   Authorized by:   Birdie Sons MD   Signed by:   Alfred Levins, CMA on 06/17/2010   Method used:   Print then Give to Patient   RxID:   [id number]

## 2010-07-09 NOTE — Miscellaneous (Signed)
Summary: [facility]  [facility]   Imported By: Marily Memos 05/27/2010 08:53:03  _____________________________________________________________________  External Attachment:    Type:   Image     Comment:   External Document

## 2010-07-23 ENCOUNTER — Telehealth: Payer: Self-pay | Admitting: Internal Medicine

## 2010-07-23 DIAGNOSIS — F909 Attention-deficit hyperactivity disorder, unspecified type: Secondary | ICD-10-CM

## 2010-07-23 MED ORDER — AMPHETAMINE-DEXTROAMPHET ER 15 MG PO CP24: 15.0000 mg | ORAL_CAPSULE | Freq: Every day | ORAL | Status: DC

## 2010-07-23 NOTE — Telephone Encounter (Signed)
rx up front ready for p/u, pt will need appt, pt aware

## 2010-07-23 NOTE — Telephone Encounter (Signed)
Needs refill on med: Adderall XR10mg 

## 2010-07-24 ENCOUNTER — Telehealth: Payer: Self-pay | Admitting: Internal Medicine

## 2010-07-24 DIAGNOSIS — F909 Attention-deficit hyperactivity disorder, unspecified type: Secondary | ICD-10-CM

## 2010-07-24 NOTE — Telephone Encounter (Signed)
Pt called and said that script for Adderall XR should be 10mg  twice not 15mg  once daily. Pt would like to return the 15mg  script and pick up 10mg  script.

## 2010-07-27 MED ORDER — AMPHETAMINE-DEXTROAMPHET ER 10 MG PO CP24: 10.0000 mg | ORAL_CAPSULE | Freq: Two times a day (BID) | ORAL | Status: DC

## 2010-07-27 NOTE — Telephone Encounter (Signed)
rx up front ready for p/u, pt needs to bring back the wrong Adderall rx for shredding, pt aware

## 2010-08-28 ENCOUNTER — Telehealth: Payer: Self-pay | Admitting: Internal Medicine

## 2010-08-28 NOTE — Telephone Encounter (Signed)
Pt called and is req to get lab work done to test for chichen pox or would pt be better off getting vx? Also wants tb test.

## 2010-08-30 NOTE — Telephone Encounter (Signed)
Ok to check varicella serology Ok to check tb test

## 2010-08-31 NOTE — Telephone Encounter (Signed)
Lm for pt to call back and schedule appt  °

## 2010-09-01 ENCOUNTER — Ambulatory Visit (INDEPENDENT_AMBULATORY_CARE_PROVIDER_SITE_OTHER): Payer: 59 | Admitting: Internal Medicine

## 2010-09-01 DIAGNOSIS — Z209 Contact with and (suspected) exposure to unspecified communicable disease: Secondary | ICD-10-CM

## 2010-09-10 ENCOUNTER — Encounter: Payer: Self-pay | Admitting: Internal Medicine

## 2010-09-14 ENCOUNTER — Ambulatory Visit (INDEPENDENT_AMBULATORY_CARE_PROVIDER_SITE_OTHER): Payer: 59 | Admitting: Internal Medicine

## 2010-09-14 ENCOUNTER — Encounter: Payer: Self-pay | Admitting: Internal Medicine

## 2010-09-14 VITALS — BP 102/64 | HR 76 | Temp 98.6°F | Wt 191.0 lb

## 2010-09-14 DIAGNOSIS — F909 Attention-deficit hyperactivity disorder, unspecified type: Secondary | ICD-10-CM

## 2010-09-14 MED ORDER — AMPHETAMINE-DEXTROAMPHET ER 10 MG PO CP24: 10.0000 mg | ORAL_CAPSULE | Freq: Two times a day (BID) | ORAL | Status: DC

## 2010-09-14 NOTE — Progress Notes (Signed)
  Subjective:    Patient ID: Calvin Ryan, male    DOB: 13-May-1981, 30 y.o.   MRN: [id number]  HPI  ADHD---taking Adderall with good results No unusual side effects  Review of Systems No palpitations    Objective:   Physical Exam  well-developed well-nourished male in no acute distress. HEENT exam atraumatic, normocephalic, neck supple without jugular venous distention. Chest clear to auscultation cardiac exam S1-S2 are regular. Abdominal exam overweight with bowel sounds, soft and nontender. Extremities no edema. Neurologic exam is alert with a normal gait.        Assessment & Plan:

## 2010-09-14 NOTE — Assessment & Plan Note (Signed)
Continue current meds He is doing well without side effects

## 2010-11-11 ENCOUNTER — Other Ambulatory Visit: Payer: Self-pay | Admitting: Internal Medicine

## 2010-11-11 DIAGNOSIS — K219 Gastro-esophageal reflux disease without esophagitis: Secondary | ICD-10-CM

## 2010-11-11 DIAGNOSIS — R1013 Epigastric pain: Secondary | ICD-10-CM

## 2010-11-13 ENCOUNTER — Ambulatory Visit
Admission: RE | Admit: 2010-11-13 | Discharge: 2010-11-13 | Disposition: A | Discharge status: Home / Self Care | Payer: 59 | Source: Ambulatory Visit | Attending: Internal Medicine | Admitting: Internal Medicine

## 2010-11-13 DIAGNOSIS — K219 Gastro-esophageal reflux disease without esophagitis: Secondary | ICD-10-CM

## 2010-11-13 DIAGNOSIS — R1013 Epigastric pain: Secondary | ICD-10-CM

## 2014-03-01 ENCOUNTER — Ambulatory Visit: Payer: Self-pay | Admitting: Family Medicine

## 2016-04-13 ENCOUNTER — Ambulatory Visit (INDEPENDENT_AMBULATORY_CARE_PROVIDER_SITE_OTHER): Payer: PRIVATE HEALTH INSURANCE | Admitting: Psychology

## 2016-04-13 DIAGNOSIS — F331 Major depressive disorder, recurrent, moderate: Secondary | ICD-10-CM | POA: Diagnosis not present

## 2016-04-27 ENCOUNTER — Ambulatory Visit (INDEPENDENT_AMBULATORY_CARE_PROVIDER_SITE_OTHER): Payer: PRIVATE HEALTH INSURANCE | Admitting: Psychology

## 2016-04-27 DIAGNOSIS — F331 Major depressive disorder, recurrent, moderate: Secondary | ICD-10-CM

## 2016-05-03 ENCOUNTER — Ambulatory Visit (INDEPENDENT_AMBULATORY_CARE_PROVIDER_SITE_OTHER): Payer: PRIVATE HEALTH INSURANCE | Admitting: Psychology

## 2016-05-03 DIAGNOSIS — F341 Dysthymic disorder: Secondary | ICD-10-CM | POA: Diagnosis not present

## 2016-05-10 ENCOUNTER — Ambulatory Visit: Payer: PRIVATE HEALTH INSURANCE | Admitting: Psychology

## 2016-05-17 ENCOUNTER — Ambulatory Visit (INDEPENDENT_AMBULATORY_CARE_PROVIDER_SITE_OTHER): Payer: PRIVATE HEALTH INSURANCE | Admitting: Psychology

## 2016-05-17 DIAGNOSIS — F331 Major depressive disorder, recurrent, moderate: Secondary | ICD-10-CM

## 2016-06-04 ENCOUNTER — Ambulatory Visit (INDEPENDENT_AMBULATORY_CARE_PROVIDER_SITE_OTHER): Payer: PRIVATE HEALTH INSURANCE | Admitting: Psychology

## 2016-06-04 DIAGNOSIS — F331 Major depressive disorder, recurrent, moderate: Secondary | ICD-10-CM | POA: Diagnosis not present

## 2016-06-18 ENCOUNTER — Ambulatory Visit: Payer: PRIVATE HEALTH INSURANCE | Admitting: Psychology

## 2016-07-02 ENCOUNTER — Ambulatory Visit (INDEPENDENT_AMBULATORY_CARE_PROVIDER_SITE_OTHER): Payer: PRIVATE HEALTH INSURANCE | Admitting: Psychology

## 2016-07-02 DIAGNOSIS — F331 Major depressive disorder, recurrent, moderate: Secondary | ICD-10-CM | POA: Diagnosis not present

## 2016-07-20 ENCOUNTER — Ambulatory Visit: Payer: PRIVATE HEALTH INSURANCE | Admitting: Psychology

## 2016-08-31 ENCOUNTER — Ambulatory Visit: Payer: PRIVATE HEALTH INSURANCE | Admitting: Psychology

## 2016-09-13 IMAGING — MR MRI OF THE LEFT KNEE WITHOUT CONTRAST
5 series · 34 of 40 positions shown · non-contrast
Comparison: None.

CLINICAL DATA: Lateral knee pain with burning for 1 year. No acute
injury or prior relevant surgery.

EXAM:
MRI OF THE LEFT KNEE WITHOUT CONTRAST
TECHNIQUE: Multiplanar, multisequence MR imaging of the knee was performed. No
intravenous contrast was administered.

[Series 3: PD fat-sat · axial · 3.0mm · 0.50mm/px · z∈[-92,+30]mm · 8 of 38 slices shown (1 of 3)]
[im 1/38]
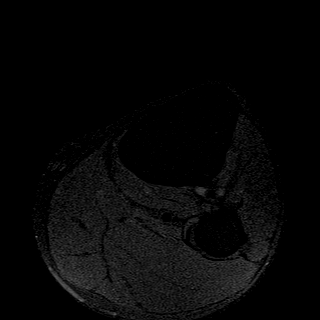
[im 6/38]
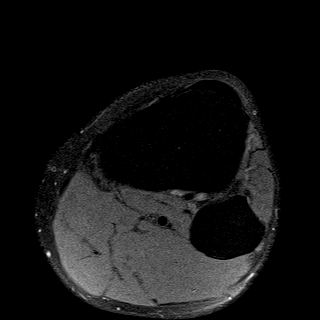
[im 11/38]
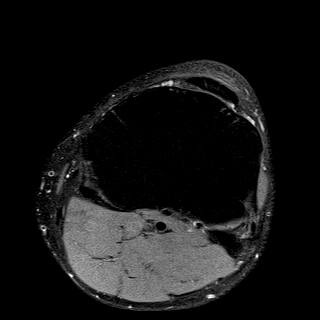
[im 16/38]
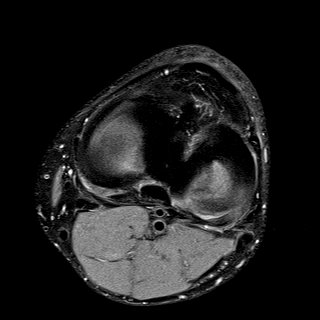
[im 22/38]
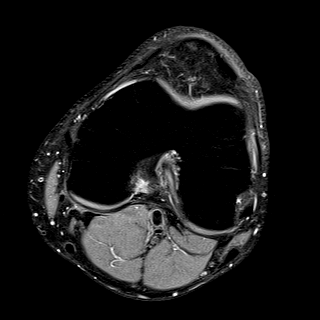
[im 27/38]
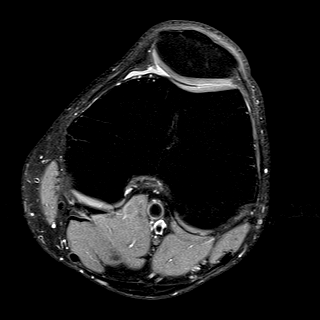
[im 32/38]
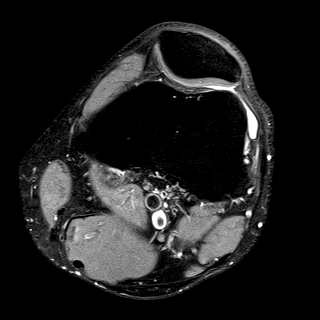
[im 38/38]
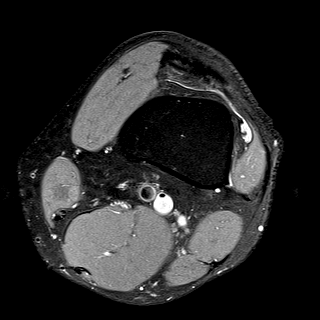

[Series 4: T1 · coronal · 3.0mm · 0.50mm/px · 2 of 37 slices shown]
[im 1/37]
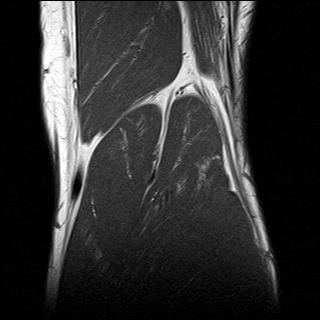
[im 6/37]
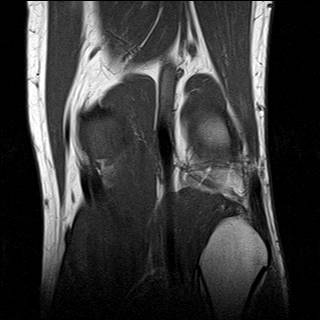

[Series 5: T2 fat-sat · coronal · 3.0mm · 0.31mm/px · 8 of 37 slices shown]
[im 1/37]
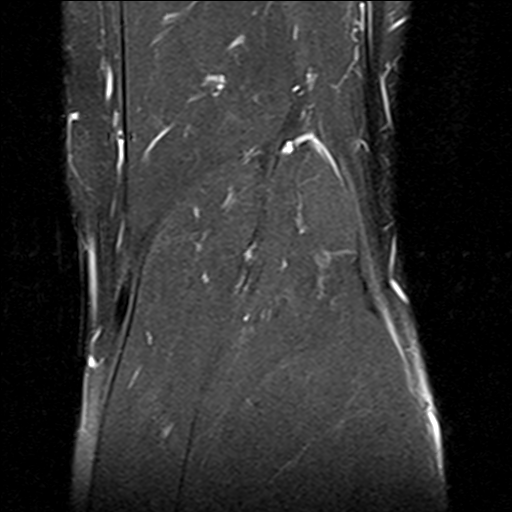
[im 6/37]
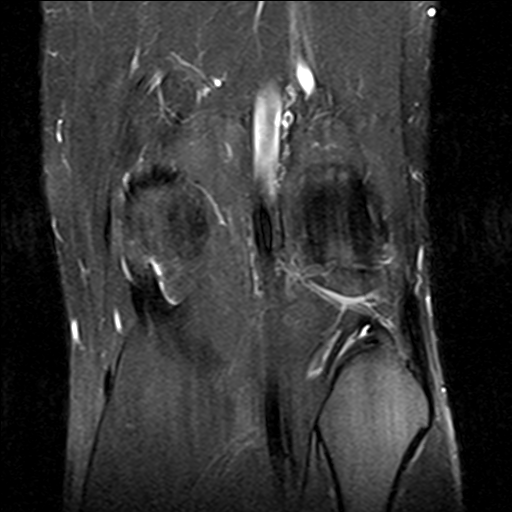
[im 11/37]
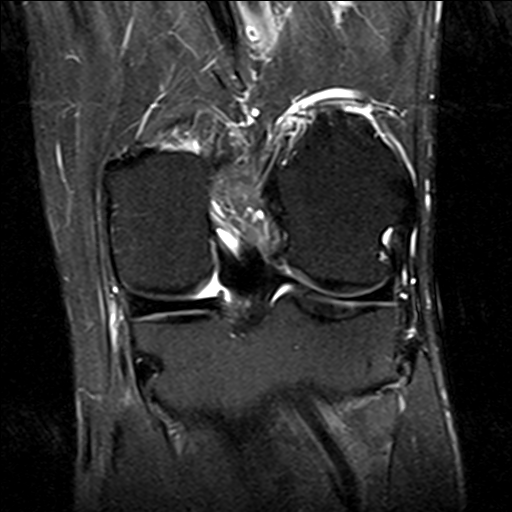
[im 16/37]
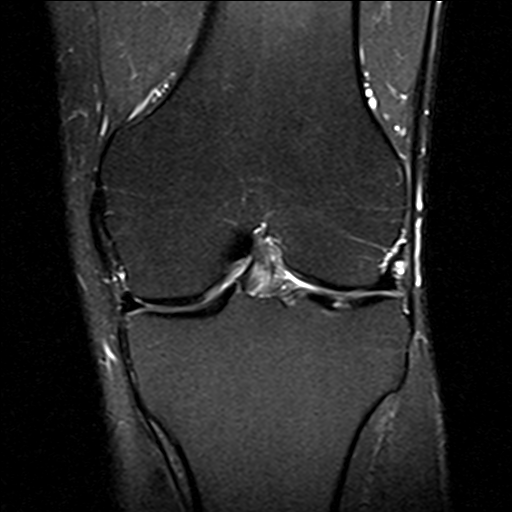
[im 21/37]
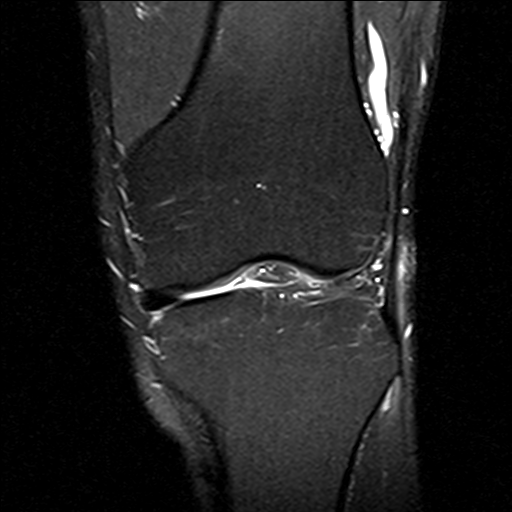
[im 26/37]
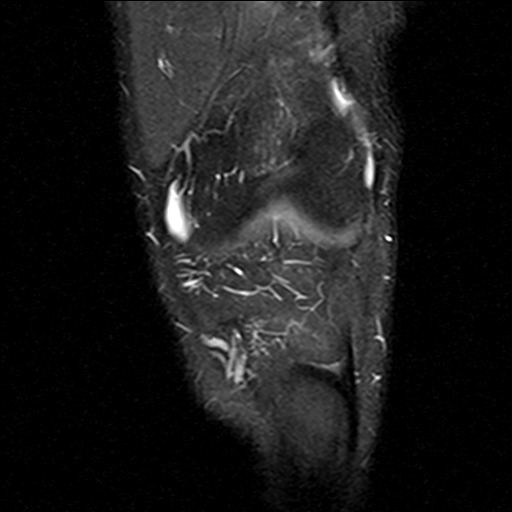
[im 31/37]
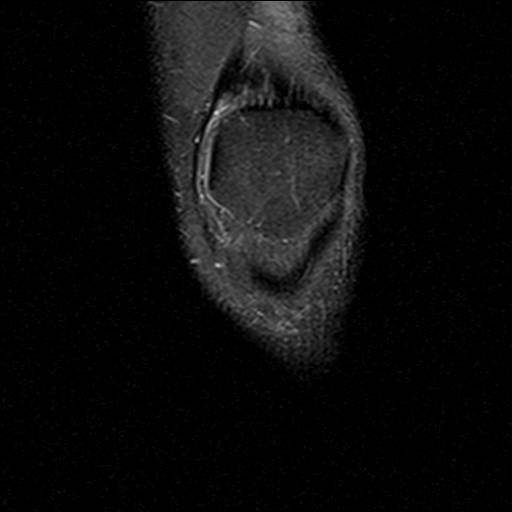
[im 37/37]
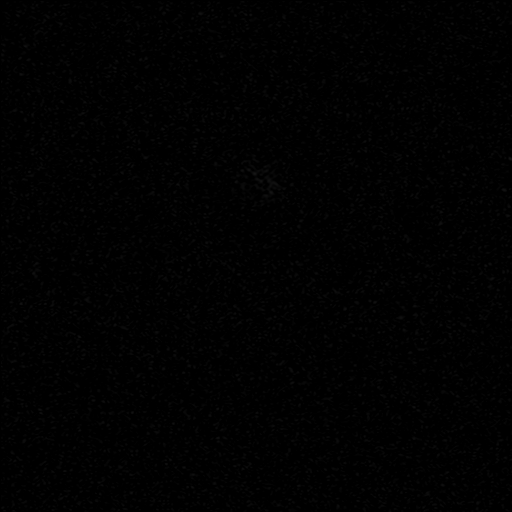

[Series 6: PD fat-sat · coronal · 3.0mm · 0.50mm/px · 8 of 37 slices shown (2 of 3)]
[im 1/37]
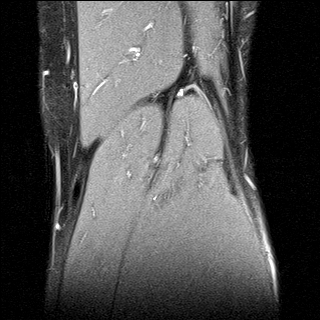
[im 6/37]
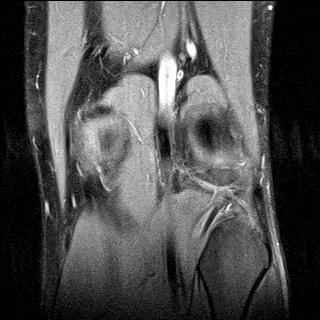
[im 11/37]
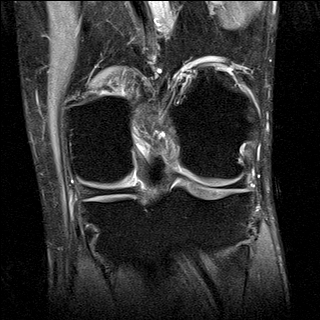
[im 16/37]
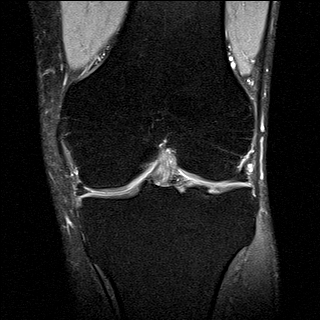
[im 21/37]
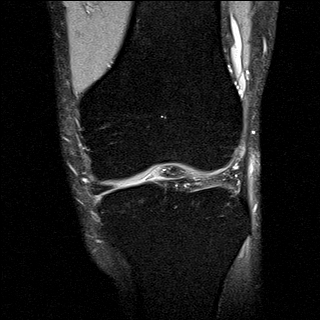
[im 26/37]
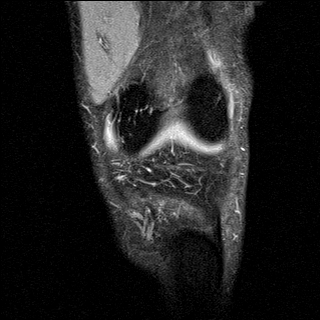
[im 31/37]
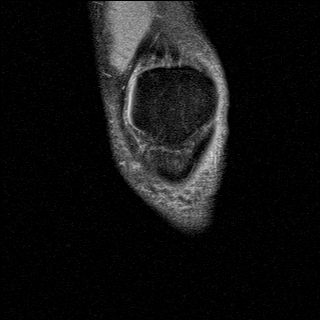
[im 37/37]
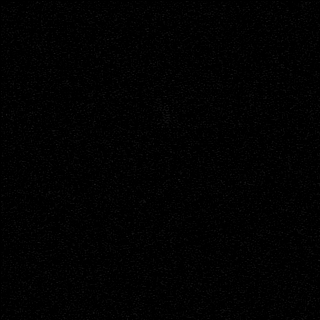

[Series 7: PD fat-sat · sagittal · 3.0mm · 0.50mm/px · 8 of 36 slices shown (3 of 3)]
[im 1/36]
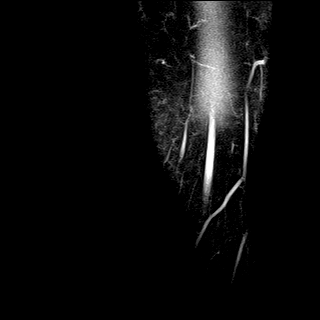
[im 6/36]
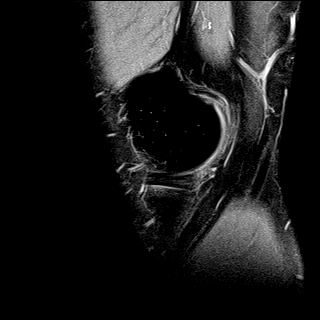
[im 11/36]
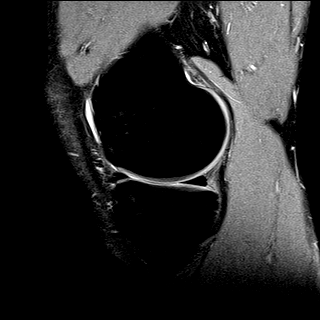
[im 16/36]
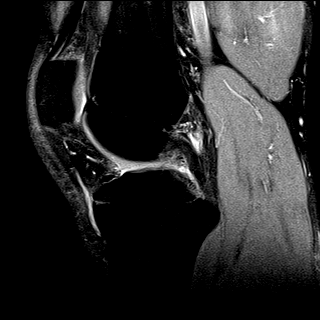
[im 21/36]
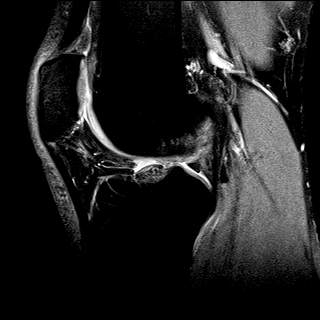
[im 26/36]
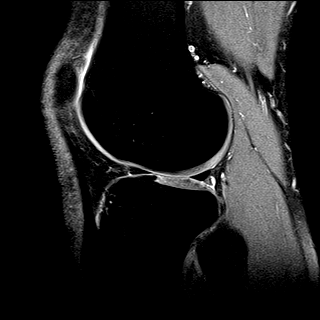
[im 31/36]
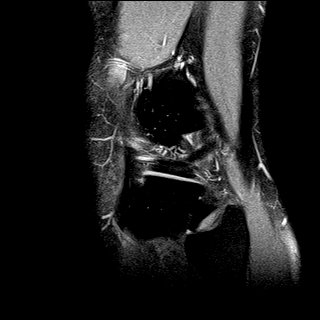
[im 36/36]
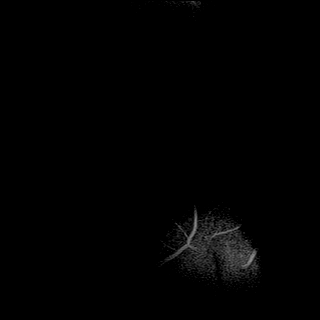

[34 of 40 positions shown; findings below may reference images not displayed]

FINDINGS: MENISCI

Medial meniscus:  Intact with normal morphology.

Lateral meniscus:  Intact with normal morphology.

LIGAMENTS

Cruciates: Intact. There is possible minimal ACL mucoid degeneration
versus normal variant. The PCL appears normal.

Collaterals: Intact. No ITB thickening or significant surrounding
edema.

CARTILAGE

Patellofemoral:  Preserved.

Medial:  Preserved.

Lateral: Minimal chondral thinning and surface irregularity over the
central portion of the lateral femoral condyle. No subchondral
signal abnormality.

Joint:  No significant joint effusion.  No evidence of loose body.

Popliteal Fossa:  Unremarkable. No Baker's cyst.

Extensor Mechanism: Intact. There is mild edema within the
prepatellar subcutaneous and infrapatellar fat.

Bones:  No significant extra-articular osseous findings.
IMPRESSION: 1. No acute findings or clear explanation for lateral knee pain. The
lateral meniscus and lateral collateral ligament complex appear
normal.
2. Mild lateral tibial chondromalacia.
3. Possible mild ACL mucoid degeneration. No evidence of ACL tear or
bone contusion.

## 2016-09-15 ENCOUNTER — Ambulatory Visit (INDEPENDENT_AMBULATORY_CARE_PROVIDER_SITE_OTHER): Payer: PRIVATE HEALTH INSURANCE | Admitting: Psychology

## 2016-09-15 DIAGNOSIS — F331 Major depressive disorder, recurrent, moderate: Secondary | ICD-10-CM

## 2016-10-06 ENCOUNTER — Ambulatory Visit: Payer: PRIVATE HEALTH INSURANCE | Admitting: Psychology

## 2016-10-28 ENCOUNTER — Ambulatory Visit (INDEPENDENT_AMBULATORY_CARE_PROVIDER_SITE_OTHER): Payer: PRIVATE HEALTH INSURANCE | Admitting: Psychology

## 2016-10-28 DIAGNOSIS — F331 Major depressive disorder, recurrent, moderate: Secondary | ICD-10-CM | POA: Diagnosis not present

## 2016-11-08 ENCOUNTER — Ambulatory Visit (INDEPENDENT_AMBULATORY_CARE_PROVIDER_SITE_OTHER): Payer: PRIVATE HEALTH INSURANCE | Admitting: Psychology

## 2016-11-08 DIAGNOSIS — F331 Major depressive disorder, recurrent, moderate: Secondary | ICD-10-CM | POA: Diagnosis not present

## 2017-02-01 ENCOUNTER — Ambulatory Visit: Payer: PRIVATE HEALTH INSURANCE | Admitting: Psychology

## 2017-03-09 ENCOUNTER — Ambulatory Visit (INDEPENDENT_AMBULATORY_CARE_PROVIDER_SITE_OTHER): Payer: PRIVATE HEALTH INSURANCE | Admitting: Psychology

## 2017-03-09 DIAGNOSIS — F331 Major depressive disorder, recurrent, moderate: Secondary | ICD-10-CM

## 2017-03-21 ENCOUNTER — Ambulatory Visit (INDEPENDENT_AMBULATORY_CARE_PROVIDER_SITE_OTHER): Payer: PRIVATE HEALTH INSURANCE | Admitting: Psychology

## 2017-03-21 DIAGNOSIS — F331 Major depressive disorder, recurrent, moderate: Secondary | ICD-10-CM | POA: Diagnosis not present

## 2017-04-06 ENCOUNTER — Ambulatory Visit (INDEPENDENT_AMBULATORY_CARE_PROVIDER_SITE_OTHER): Payer: PRIVATE HEALTH INSURANCE | Admitting: Psychology

## 2017-04-06 DIAGNOSIS — F331 Major depressive disorder, recurrent, moderate: Secondary | ICD-10-CM | POA: Diagnosis not present

## 2017-04-21 ENCOUNTER — Ambulatory Visit: Payer: PRIVATE HEALTH INSURANCE | Admitting: Psychology

## 2017-05-11 ENCOUNTER — Ambulatory Visit: Payer: Self-pay

## 2017-05-11 ENCOUNTER — Encounter: Payer: Self-pay | Admitting: Sports Medicine

## 2017-05-11 ENCOUNTER — Other Ambulatory Visit: Payer: Self-pay | Admitting: Sports Medicine

## 2017-05-11 ENCOUNTER — Ambulatory Visit (INDEPENDENT_AMBULATORY_CARE_PROVIDER_SITE_OTHER): Payer: PRIVATE HEALTH INSURANCE | Admitting: Sports Medicine

## 2017-05-11 VITALS — BP 124/74 | HR 74 | Ht 74.0 in | Wt 211.6 lb

## 2017-05-11 DIAGNOSIS — M6780 Other specified disorders of synovium and tendon, unspecified site: Secondary | ICD-10-CM | POA: Insufficient documentation

## 2017-05-11 DIAGNOSIS — M216X9 Other acquired deformities of unspecified foot: Secondary | ICD-10-CM | POA: Diagnosis not present

## 2017-05-11 DIAGNOSIS — M7672 Peroneal tendinitis, left leg: Secondary | ICD-10-CM | POA: Diagnosis not present

## 2017-05-11 DIAGNOSIS — M6788 Other specified disorders of synovium and tendon, other site: Secondary | ICD-10-CM

## 2017-05-11 DIAGNOSIS — M25572 Pain in left ankle and joints of left foot: Secondary | ICD-10-CM | POA: Diagnosis not present

## 2017-05-11 MED ORDER — DICLOFENAC SODIUM 2 % TD SOLN: 1.0000 "application " | Freq: Two times a day (BID) | TRANSDERMAL | 0 refills | Status: AC

## 2017-05-11 NOTE — Procedures (Signed)
LIMITED MSK ULTRASOUND OF left ankle Images were obtained and interpreted by myself, Gaspar BiddingMichael Rigby, DO  Images have been saved and stored to PACS system. Images obtained on: GE S7 Ultrasound machine  FINDINGS:   Peroneal tendons are intact there is a small amount of hypoechoic fluid surrounding the peroneal longus at the level of the lateral malleolus.  Just distal to this is a small approximately 1 cm x 1 cm cystic structure.  There is no increased neovascularity.  IMPRESSION:  1. Mild left peroneal longus tendinosis 2. Ganglion cyst of the left peroneal longus tendon

## 2017-05-11 NOTE — Progress Notes (Signed)
Calvin FellsMichael D. Delorise Shinerigby, DO  [facility] [place] [phone]  Calvin Lintsllan C Ryan - 36 y.o. male MRN [id number]  Date of birth: 19-Sep-1980   Scribe for today's visit: Calvin FabianMolly Ryan, ATC    SUBJECTIVE:  Calvin LintsAllan C Ryan is here for New Patient (Initial Visit) (L ankle pain)  His L ankle bump/swelling symptoms just below the peroneal tendons distal to his L lateral malleolus INITIALLY: Began about 3-4 years ago initially but had a recent increase in size over the past week.  No MOI noted. Worsened with: nothing noted Improved with : nothing noted Additional associated symptoms include: no radiating pain and no N/T into the L foot.    At this time symptoms are worsening compared to onset  He has recently been on a Steroid for poison ivy and notes this cyst has somewhat worsening following stopping this.  Otherwise no specific therapeutic exercises other than general foot doming   ROS Denies night time disturbances. Denies fevers, chills, or night sweats. Denies unexplained weight loss. Denies personal history of cancer. Denies changes in bowel or bladder habits. Denies recent unreported falls. Denies new or worsening dyspnea or wheezing. Denies headaches or dizziness.  Denies numbness, tingling or weakness  In the extremities.  Denies dizziness or presyncopal episodes Denies lower extremity edema    HISTORY & PERTINENT PRIOR DATA:  Prior History reviewed and updated per electronic medical record. Significant history, findings, studies and interim changes include: No additional findings.  reports that he has quit smoking. he has never used smokeless tobacco. No results for input(s): HGBA1C, LABURIC, CREATINE in the last 8760 hours. Problem  Cyst of Tendon Sheath     OBJECTIVE:  VS:  HT:6\' 2"  (188 cm)   WT:211 lb 9.6 oz (96 kg)  BMI:27.16    BP:124/74  HR:74bpm  TEMP: ( )  RESP:99 %  PHYSICAL EXAM: Constitutional: WDWN, Non-toxic  appearing. Psychiatric: Alert & appropriately interactive. Not depressed or anxious appearing. Respiratory: No increased work of breathing. Trachea Midline Eyes: Pupils are equal. EOM intact without nystagmus. No scleral icterus  LOWER EXTREMITIES: No clubbing or cyanosis appreciated No significant venous stasis changes No calf tenderness, negative Homan's sign, no calf cords Generalized/Pre-tibial edema: none Pedal Pulses: Normal & symmetrically palpable  Sensation in LE dermatomes: intact to light touch   Bilateral feet and ankles: Left ankle is overall well aligned.  He is ligamentously stable.  Intrinsic ankle strength is intact.  He does have a small palpable nodule along the anterior lateral ankle just distal to the lateral malleolus that is not significantly painful. Marked splaying of the bilateral feet with callus on the plantar aspect consistent with a Morton's callus.  He has early clawing of all 5 toes bilaterally no significant nail changes.  He has a moderately high longitudinal arch.  ASSESSMENT & PLAN:   1. Acute left ankle pain   2. Cyst of tendon sheath   3. Loss of transverse plantar arch, unspecified laterality   4. Peroneal tendinosis, left    Plan: Discussed multiple options with her today including injection versus topical treatment.  Start topical pen said and attempted provided for this today.  He will call if he would actually like a prescription. Recommend over-the-counter compression if he is active He does need improved transverse arch support.  Provided him with a longitudinal transverse arch HaPad to be worn his work shoes as well as a large metatarsal cookie for his running shoes.  Can consider  custom cushion orthotics Continue with intrinsic foot strengthening as well as peroneal exercises.  No problem-specific Assessment & Plan notes found for this encounter.   ++++++++++++++++++++++++++++++++++++++++++++ Orders:  Orders Placed This Encounter    Procedures  . US LIMITED JOINT SPACE STRUCTURES LOW LEFT(NO LINKED CHARGES)    Meds:  Meds ordered this encounter  Medications  . Diclofenac Sodium (PENNSAID) 2 % SOLN    Sig: Place 1 application onto the skin 2 (two) times daily for 1 day.    Dispense:  8 g    Refill:  0    ++++++++++++++++++++++++++++++++++++++++++++  Follow-up: Return if symptoms worsen or fail to improve.   Pertinent documentation may be included in additional procedure notes, imaging studies, problem based documentation and patient instructions. Please see these sections of the encounter for additional information regarding this visit. CMA/ATC served as Neurosurgeonscribe during this visit. History, Physical, and Plan performed by medical provider. Documentation and orders reviewed and attested to.      Calvin MewsMichael D Araceli Arango, DO    [facility]

## 2017-05-16 ENCOUNTER — Ambulatory Visit: Payer: PRIVATE HEALTH INSURANCE | Admitting: Psychology

## 2017-06-23 ENCOUNTER — Ambulatory Visit (INDEPENDENT_AMBULATORY_CARE_PROVIDER_SITE_OTHER): Payer: PRIVATE HEALTH INSURANCE | Admitting: Psychology

## 2017-06-23 DIAGNOSIS — F331 Major depressive disorder, recurrent, moderate: Secondary | ICD-10-CM | POA: Diagnosis not present

## 2017-07-04 ENCOUNTER — Ambulatory Visit (INDEPENDENT_AMBULATORY_CARE_PROVIDER_SITE_OTHER): Payer: PRIVATE HEALTH INSURANCE | Admitting: Psychology

## 2017-07-04 DIAGNOSIS — F331 Major depressive disorder, recurrent, moderate: Secondary | ICD-10-CM | POA: Diagnosis not present

## 2017-07-18 ENCOUNTER — Ambulatory Visit: Payer: Self-pay | Admitting: Psychology

## 2017-08-01 ENCOUNTER — Ambulatory Visit (INDEPENDENT_AMBULATORY_CARE_PROVIDER_SITE_OTHER): Payer: PRIVATE HEALTH INSURANCE | Admitting: Psychology

## 2017-08-01 DIAGNOSIS — F331 Major depressive disorder, recurrent, moderate: Secondary | ICD-10-CM

## 2017-08-05 ENCOUNTER — Ambulatory Visit (INDEPENDENT_AMBULATORY_CARE_PROVIDER_SITE_OTHER): Payer: PRIVATE HEALTH INSURANCE | Admitting: Sports Medicine

## 2017-08-05 ENCOUNTER — Encounter: Payer: Self-pay | Admitting: Sports Medicine

## 2017-08-05 VITALS — BP 120/78 | HR 70 | Ht 74.0 in | Wt 212.2 lb

## 2017-08-05 DIAGNOSIS — F909 Attention-deficit hyperactivity disorder, unspecified type: Secondary | ICD-10-CM | POA: Diagnosis not present

## 2017-08-05 DIAGNOSIS — K219 Gastro-esophageal reflux disease without esophagitis: Secondary | ICD-10-CM

## 2017-08-05 DIAGNOSIS — R0609 Other forms of dyspnea: Secondary | ICD-10-CM

## 2017-08-05 MED ORDER — OMEPRAZOLE 40 MG PO CPDR: 40.0000 mg | DELAYED_RELEASE_CAPSULE | Freq: Every day | ORAL | 2 refills | Status: DC

## 2017-08-05 NOTE — Patient Instructions (Signed)
   [email or web address]  Kardia mobile EKG monitor, portable

## 2017-08-05 NOTE — Assessment & Plan Note (Signed)
If any lack of improvement or worsening tachypalpitations will need to reevaluate ADHD medicine on this dose for quite a while and seems stable.

## 2017-08-05 NOTE — Progress Notes (Signed)
Calvin Ryan. [facility] [phone]  Calvin Ryan - 37 y.o. male MRN [id number]  Date of birth: [birth year]  Visit Date: 08/05/2017  PCP: Georgann Housekeeper, MD   Referred by: Georgann Housekeeper, MD   Scribe for today's visit: Stevenson Clinch, CMA     SUBJECTIVE:  Calvin Ryan is here for SOB with exertion  His SOB with running symptoms INITIALLY: Began over the past couple of months and normally occurs for a day or two after a hard workout. This has been occurring periodically during his training.  Described as mild/moderate heavy breathing, he denies chest pain/tightness while running.  Worsened with running Improved with rest Additional associated symptoms include: He reports SOB at times even when at rest. He has noticed abnormalities with HR while running, sometimes increased HR (25-30 beats faster than normal) other times decreased HR (15-20 beats slower than normal). Normal HR while running is about 160. He ran for an hour and couldn't keep HR below 200, another times he couldn't get his HR above 140. He has bene using HR chest strap.     At this time symptoms show no change compared to onset, sx last for a couple of days and "reset", then restart after a hard workout.  He has tried taking Claritin once and didn't notice much of a difference. He was on Medrol Dosepak from 2/15-2/20 from dentist, this was around the same time his HR was increased.    ROS Denies night time disturbances. Denies fevers, chills, or night sweats. Denies unexplained weight loss. Denies personal history of cancer. Denies changes in bowel or bladder habits. Denies recent unreported falls. Reports new or worsening dyspnea or wheezing. Reports headaches and dizziness. He became so dizzy that it was hard for him to think.  Denies numbness, tingling or weakness  In the extremities.  Denies dizziness or presyncopal episodes Denies lower  extremity edema    HISTORY & PERTINENT PRIOR DATA:  Prior History reviewed and updated per electronic medical record.  Significant history, findings, studies and interim changes include:  reports that he has quit smoking. he has never used smokeless tobacco. No results for input(s): HGBA1C, LABURIC, CREATINE in the last 8760 hours. No specialty comments available. Problem  Dyspnea On Exertion  Attention Deficit Hyperactivity Disorder (Adhd)    OBJECTIVE:  VS:  HT:6\' 2"  (188 cm)   WT:212 lb 3.2 oz (96.3 kg)  BMI:27.23    BP:120/78  HR:70bpm  TEMP: ( )  RESP:98 %   PHYSICAL EXAM: Constitutional: WDWN, Non-toxic appearing. Psychiatric: Alert & appropriately interactive.  Not depressed or anxious appearing. Respiratory: No increased work of breathing.  Trachea Midline Eyes: Pupils are equal.  EOM intact without nystagmus.  No scleral icterus  NEUROVASCULAR exam: No clubbing or cyanosis appreciated No significant venous stasis changes Capillary Refill: normal, less than 2 seconds   HEART:  RRR, S1/S2 heard, no murmur  LUNGS:  CTA B  NECK: Overall normal aligntment. No thyromegaly. No JVD  ASSESSMENT & PLAN:   1. Dyspnea on exertion   2. Gastroesophageal reflux disease, esophagitis presence not specified   3. Attention deficit hyperactivity disorder (ADHD), unspecified ADHD type    ++++++++++++++++++++++++++++++++++++++++++++ Orders & Meds:  Orders Placed This Encounter  Procedures  . EKG 12-Lead    Meds ordered this encounter  Medications  . omeprazole (PRILOSEC) 40 MG capsule    Sig: Take 1 capsule (40 mg total) by mouth daily.  Dispense:  30 capsule    Refill:  2    ++++++++++++++++++++++++++++++++++++++++++++ PLAN:   Dyspnea on exertion Interesting presentation of symptoms.  No true chest pain on exertion or significant systemic signs other than a single isolated episode yesterday when his heart rate actually remained relatively low during the  incident.  Likelihood of SVT or significant cardiac conduction problem is low given the correlating symptoms with his running heart rate monitor.  We did discuss obtaining a validated portable ECG (Cardia monitor) to verify the accuracy of his own heart rate monitor.  His symptoms do seem to be more respiratory in nature and may be related to silent reflux I like to start him on a PPI for 2-3 months to ensure resolution and will touch base with him if he is having any lack of improvement.  He does have close follow-up with his PCP and recently had labs in December that were ordered as normal although he did have some slight aberrancies in his TSH previously.  If any persistent ongoing problems or lack of improvement further diagnostic evaluation with treadmill stress test and repeat lab work will be indicated.  >50% of this 25 minute visit spent in direct patient counseling and/or coordination of care.  Discussion was focused on education regarding the in discussing the pathoetiology and anticipated clinical course of the above condition.  Attention deficit hyperactivity disorder (ADHD) If any lack of improvement or worsening tachypalpitations will need to reevaluate ADHD medicine on this dose for quite a while and seems stable.   Follow-up: No Follow-up on file.   Pertinent documentation may be included in additional procedure notes, imaging studies, problem based documentation and patient instructions. Please see these sections of the encounter for additional information regarding this visit. CMA/ATC served as Neurosurgeonscribe during this visit. History, Physical, and Plan performed by medical provider. Documentation and orders reviewed and attested to.      Andrena MewsMichael D Osby Sweetin, DO    [facility]

## 2017-08-05 NOTE — Assessment & Plan Note (Signed)
Interesting presentation of symptoms.  No true chest pain on exertion or significant systemic signs other than a single isolated episode yesterday when his heart rate actually remained relatively low during the incident.  Likelihood of SVT or significant cardiac conduction problem is low given the correlating symptoms with his running heart rate monitor.  We did discuss obtaining a validated portable ECG (Cardia monitor) to verify the accuracy of his own heart rate monitor.  His symptoms do seem to be more respiratory in nature and may be related to silent reflux I like to start him on a PPI for 2-3 months to ensure resolution and will touch base with him if he is having any lack of improvement.  He does have close follow-up with his PCP and recently had labs in December that were ordered as normal although he did have some slight aberrancies in his TSH previously.  If any persistent ongoing problems or lack of improvement further diagnostic evaluation with treadmill stress test and repeat lab work will be indicated.  >50% of this 25 minute visit spent in direct patient counseling and/or coordination of care.  Discussion was focused on education regarding the in discussing the pathoetiology and anticipated clinical course of the above condition.

## 2017-08-05 NOTE — Procedures (Addendum)
12 Lead resting [facility]  Performed on 08/05/2017) Interpreted by myself Andrena Mews(Khamiya Varin D Eternity Dexter, DO) during the office visit.  Results were reviewed with the patient at the time of the visit.   FINDINGS:  Rate & Rhythm: 71 NSR  Axis:  Normal  Intervals:  Normal  Other: LVH, consistent with Athletic Heart. Slight peaking of T-waves is related   IMPRESSION:  1. Normal resting ECG for an athlete.  2. LVH criteria is incidental

## 2017-08-29 ENCOUNTER — Ambulatory Visit (INDEPENDENT_AMBULATORY_CARE_PROVIDER_SITE_OTHER): Payer: PRIVATE HEALTH INSURANCE | Admitting: Psychology

## 2017-08-29 DIAGNOSIS — F331 Major depressive disorder, recurrent, moderate: Secondary | ICD-10-CM

## 2017-09-12 ENCOUNTER — Ambulatory Visit (INDEPENDENT_AMBULATORY_CARE_PROVIDER_SITE_OTHER): Payer: PRIVATE HEALTH INSURANCE | Admitting: Psychology

## 2017-09-12 ENCOUNTER — Encounter: Payer: Self-pay | Admitting: Sports Medicine

## 2017-09-12 DIAGNOSIS — F331 Major depressive disorder, recurrent, moderate: Secondary | ICD-10-CM | POA: Diagnosis not present

## 2017-09-26 ENCOUNTER — Ambulatory Visit: Payer: PRIVATE HEALTH INSURANCE | Admitting: Psychology

## 2017-09-29 ENCOUNTER — Ambulatory Visit (INDEPENDENT_AMBULATORY_CARE_PROVIDER_SITE_OTHER): Payer: PRIVATE HEALTH INSURANCE | Admitting: Psychology

## 2017-09-29 DIAGNOSIS — F331 Major depressive disorder, recurrent, moderate: Secondary | ICD-10-CM

## 2017-10-10 ENCOUNTER — Ambulatory Visit (INDEPENDENT_AMBULATORY_CARE_PROVIDER_SITE_OTHER): Payer: PRIVATE HEALTH INSURANCE | Admitting: Psychology

## 2017-10-10 DIAGNOSIS — F331 Major depressive disorder, recurrent, moderate: Secondary | ICD-10-CM

## 2017-10-24 ENCOUNTER — Ambulatory Visit (INDEPENDENT_AMBULATORY_CARE_PROVIDER_SITE_OTHER): Payer: PRIVATE HEALTH INSURANCE | Admitting: Psychology

## 2017-10-24 DIAGNOSIS — F331 Major depressive disorder, recurrent, moderate: Secondary | ICD-10-CM

## 2017-11-07 ENCOUNTER — Ambulatory Visit (INDEPENDENT_AMBULATORY_CARE_PROVIDER_SITE_OTHER): Payer: PRIVATE HEALTH INSURANCE | Admitting: Psychology

## 2017-11-07 DIAGNOSIS — F331 Major depressive disorder, recurrent, moderate: Secondary | ICD-10-CM | POA: Diagnosis not present

## 2017-11-21 ENCOUNTER — Ambulatory Visit: Payer: PRIVATE HEALTH INSURANCE | Admitting: Psychology

## 2017-12-05 ENCOUNTER — Ambulatory Visit (INDEPENDENT_AMBULATORY_CARE_PROVIDER_SITE_OTHER): Payer: PRIVATE HEALTH INSURANCE | Admitting: Psychology

## 2017-12-05 DIAGNOSIS — F331 Major depressive disorder, recurrent, moderate: Secondary | ICD-10-CM

## 2017-12-19 ENCOUNTER — Ambulatory Visit (INDEPENDENT_AMBULATORY_CARE_PROVIDER_SITE_OTHER): Payer: PRIVATE HEALTH INSURANCE | Admitting: Psychology

## 2017-12-19 DIAGNOSIS — F331 Major depressive disorder, recurrent, moderate: Secondary | ICD-10-CM | POA: Diagnosis not present

## 2018-01-02 ENCOUNTER — Ambulatory Visit: Payer: PRIVATE HEALTH INSURANCE | Admitting: Psychology

## 2018-01-16 ENCOUNTER — Ambulatory Visit (INDEPENDENT_AMBULATORY_CARE_PROVIDER_SITE_OTHER): Payer: PRIVATE HEALTH INSURANCE | Admitting: Psychology

## 2018-01-16 DIAGNOSIS — F331 Major depressive disorder, recurrent, moderate: Secondary | ICD-10-CM | POA: Diagnosis not present

## 2018-01-30 ENCOUNTER — Ambulatory Visit (INDEPENDENT_AMBULATORY_CARE_PROVIDER_SITE_OTHER): Payer: PRIVATE HEALTH INSURANCE | Admitting: Psychology

## 2018-01-30 DIAGNOSIS — F331 Major depressive disorder, recurrent, moderate: Secondary | ICD-10-CM

## 2018-02-13 ENCOUNTER — Ambulatory Visit: Payer: PRIVATE HEALTH INSURANCE | Admitting: Psychology

## 2018-02-27 ENCOUNTER — Ambulatory Visit (INDEPENDENT_AMBULATORY_CARE_PROVIDER_SITE_OTHER): Payer: PRIVATE HEALTH INSURANCE | Admitting: Sports Medicine

## 2018-02-27 ENCOUNTER — Ambulatory Visit (INDEPENDENT_AMBULATORY_CARE_PROVIDER_SITE_OTHER): Payer: PRIVATE HEALTH INSURANCE | Admitting: Psychology

## 2018-02-27 ENCOUNTER — Encounter: Payer: Self-pay | Admitting: Sports Medicine

## 2018-02-27 ENCOUNTER — Ambulatory Visit: Payer: Self-pay

## 2018-02-27 VITALS — BP 136/88 | HR 75 | Ht 74.0 in | Wt 214.0 lb

## 2018-02-27 DIAGNOSIS — M25562 Pain in left knee: Secondary | ICD-10-CM | POA: Diagnosis not present

## 2018-02-27 DIAGNOSIS — F331 Major depressive disorder, recurrent, moderate: Secondary | ICD-10-CM

## 2018-02-27 NOTE — Patient Instructions (Addendum)

## 2018-02-27 NOTE — Progress Notes (Signed)
Veverly FellsMichael D. Delorise Shinerigby, DO  [facility] Calvin Ryan [phone]  Calvin Lintsllan C Burkhammer - 37 y.o. male MRN [id number]  Date of birth: Jan 30, 1981  Visit Date: 02/27/2018  PCP: Georgann HousekeeperHusain, Karrar, MD   Referred by: Georgann HousekeeperHusain, Karrar, MD   Scribe(s) for today's visit: Stevenson ClinchBrandy Coleman, CMA  SUBJECTIVE:  Calvin LintsAllan C Ryan is here for L knee Ryan   HPI: His L knee Ryan symptoms INITIALLY: Began around 06/2011 and is related to running. Sx began to flare up this past Saturday during a race.  Described as moderate aching, non-radiating. Worsened with running long distances or down hill.  Improved with rest. He is normally able to walk with no Ryan.  Additional associated symptoms include: Ryan is mostly lateral. He denies swelling around the knee, clicking, popping, weakness.    At this time symptoms are improving compared to onset. He has been using ice, Pennsaid, Voltaren, IBU with some relief.    No recent XR of the knee.   REVIEW OF SYSTEMS: Reports night time disturbances. Denies fevers, chills, or night sweats. Denies unexplained weight loss. Denies personal history of cancer. Denies changes in bowel or bladder habits. Denies recent unreported falls. Denies new or worsening dyspnea or wheezing. Denies headaches or dizziness.  Denies numbness, tingling or weakness  In the extremities.  Denies dizziness or presyncopal episodes Denies lower extremity edema   HISTORY:  Prior history reviewed and updated per electronic medical record.  Social History   Occupational History  . Not on file  Tobacco Use  . Smoking status: Former Games developermoker  . Smokeless tobacco: Never Used  Substance and Sexual Activity  . Alcohol use: Yes  . Drug use: Not on file  . Sexual activity: Not on file   Social History   Social History Narrative  . Not on file     DATA OBTAINED & REVIEWED:  No results for input(s): HGBA1C, LABURIC, CREATINE in the last 8760  hours. .   OBJECTIVE:  VS:  HT:6\' 2"  (188 cm)   WT:214 lb (97.1 kg)  BMI:27.46    BP:136/88  HR:75bpm  TEMP: ( )  RESP:98 %   PHYSICAL EXAM: CONSTITUTIONAL: Well-developed, Well-nourished and In no acute distress PSYCHIATRIC: Alert & appropriately interactive. and Not depressed or anxious appearing. RESPIRATORY: No increased work of breathing and Trachea Midline EYES: Pupils are equal., EOM intact without nystagmus. and No scleral icterus.  VASCULAR EXAM: Warm and well perfused NEURO: unremarkable  MSK Exam: Left knee  Well aligned, no significant deformity. No overlying skin changes. TTP over Lateral femoral condyle and lateral capsule in general.   RANGE OF MOTION & STRENGTH  Normal, non-painful Knee flexion, extension, hip abduction   SPECIALITY TESTING:  Slightly positive Noble test.  Negative McMurray's.  Small effusions bilaterally expected following the Ultram marathon.  Ligamentously stable     ASSESSMENT   1. Left knee Ryan, unspecified chronicity     PLAN:  Pertinent additional documentation may be included in corresponding procedure notes, imaging studies, problem based documentation and patient instructions.  Procedures:  . US Guided Injection per procedure note  Medications:  No orders of the defined types were placed in this encounter.  Discussion/Instructions: No problem-specific Assessment & Plan notes found for this encounter.  . Small effusions bilaterally likely related to the ultra marathon he is just recently completed.  Did discuss that this seems likely related to recurrent IT band syndrome versus small capsular irritation over the lateral condyle.  Ultrasound-guided  injection directly into this area provided today. . Discussed red flag symptoms that warrant earlier emergent evaluation and patient voices understanding. . Activity modifications and the importance of avoiding exacerbating activities (limiting Ryan to no more than a 4 / 10  during or following activity) recommended and discussed.  Follow-up:  . Return if symptoms worsen or fail to improve.   . If any lack of improvement consider: further diagnostic evaluation with Repeat MSK ultrasound or MRI if persistent worsening symptoms  . At follow up will plan to consider: repeat corticosteroid injections     CMA/ATC served as scribe during this visit. History, Physical, and Plan performed by medical provider. Documentation and orders reviewed and attested to.      Andrena Mews, DO    [facility] Physician

## 2018-02-27 NOTE — Procedures (Signed)
PROCEDURE NOTE:  Ultrasound Guided: Injection: Left Knee Images were obtained and interpreted by myself, Gaspar BiddingMichael Bjorn Hallas, DO  Images have been saved and stored to PACS system. Images obtained on: GE S7 Ultrasound machine    ULTRASOUND FINDINGS:  Calcific change at the level of the lateral femoral condyle and lateral meniscus  DESCRIPTION OF PROCEDURE:  The patient's clinical condition is marked by substantial pain and/or significant functional disability. Other conservative therapy has not provided relief, is contraindicated, or not appropriate. There is a reasonable likelihood that injection will significantly improve the patient's pain and/or functional impairment.   After discussing the risks, benefits and expected outcomes of the injection and all questions were reviewed and answered, the patient wished to undergo the above named procedure.  Verbal consent was obtained.  The ultrasound was used to identify the target structure and adjacent neurovascular structures. The skin was then prepped in sterile fashion and the target structure was injected under direct visualization using sterile technique as below:  Single injection performed as below: PREP: Alcohol and Ethel Chloride APPROACH:direct, single injection, 25g 1.5 in. INJECTATE: 2 cc 0.5% Marcaine and 2 cc 40mg /mL DepoMedrol ASPIRATE: None DRESSING: Band-Aid  Post procedural instructions including recommending icing and warning signs for infection were reviewed.    This procedure was well tolerated and there were no complications.   IMPRESSION: Succesful Ultrasound Guided: Injection

## 2018-03-13 ENCOUNTER — Ambulatory Visit: Payer: PRIVATE HEALTH INSURANCE | Admitting: Psychology

## 2018-03-15 ENCOUNTER — Encounter: Payer: Self-pay | Admitting: Sports Medicine

## 2018-03-16 NOTE — Telephone Encounter (Signed)
error 

## 2018-03-19 ENCOUNTER — Encounter: Payer: Self-pay | Admitting: Sports Medicine

## 2018-04-10 ENCOUNTER — Ambulatory Visit (INDEPENDENT_AMBULATORY_CARE_PROVIDER_SITE_OTHER): Payer: PRIVATE HEALTH INSURANCE | Admitting: Psychology

## 2018-04-10 DIAGNOSIS — F331 Major depressive disorder, recurrent, moderate: Secondary | ICD-10-CM | POA: Diagnosis not present

## 2018-05-22 ENCOUNTER — Ambulatory Visit (INDEPENDENT_AMBULATORY_CARE_PROVIDER_SITE_OTHER): Payer: PRIVATE HEALTH INSURANCE | Admitting: Psychology

## 2018-05-22 DIAGNOSIS — F331 Major depressive disorder, recurrent, moderate: Secondary | ICD-10-CM

## 2018-07-03 ENCOUNTER — Ambulatory Visit: Payer: PRIVATE HEALTH INSURANCE | Admitting: Psychology

## 2018-07-06 ENCOUNTER — Ambulatory Visit: Payer: PRIVATE HEALTH INSURANCE | Admitting: Psychology

## 2018-07-12 ENCOUNTER — Ambulatory Visit (INDEPENDENT_AMBULATORY_CARE_PROVIDER_SITE_OTHER): Payer: PRIVATE HEALTH INSURANCE | Admitting: Psychology

## 2018-07-12 DIAGNOSIS — F331 Major depressive disorder, recurrent, moderate: Secondary | ICD-10-CM

## 2018-07-21 ENCOUNTER — Encounter: Payer: Self-pay | Admitting: Sports Medicine

## 2018-07-21 ENCOUNTER — Ambulatory Visit (INDEPENDENT_AMBULATORY_CARE_PROVIDER_SITE_OTHER): Payer: PRIVATE HEALTH INSURANCE | Admitting: Sports Medicine

## 2018-07-21 ENCOUNTER — Ambulatory Visit: Payer: Self-pay

## 2018-07-21 VITALS — BP 124/76 | HR 72 | Ht 74.0 in | Wt 217.8 lb

## 2018-07-21 DIAGNOSIS — M25562 Pain in left knee: Secondary | ICD-10-CM | POA: Diagnosis not present

## 2018-07-21 DIAGNOSIS — G8929 Other chronic pain: Secondary | ICD-10-CM

## 2018-07-21 NOTE — Procedures (Signed)
LIMITED MSK ULTRASOUND OF Left knee Images were obtained and interpreted by myself, Gaspar Bidding, DO  Images have been saved and stored to PACS system. Images obtained on: GE S7 Ultrasound machine  FINDINGS:   Posterior views of the knee reveal abnormal tissue disruption within the posterior knee.  This does correlate with the area of the plantaris muscle which is on the medial aspect of the lateral calf musculature.    There is increased neovascularity and transverse disruption/disruption within this muscle region.  There is hypoechoic change within the muscle belly   no evidence of significant tendon disruption.  IMPRESSION:  1. 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

## 2018-07-21 NOTE — Progress Notes (Signed)
Calvin Ryan. Calvin [facility] at [facility] [phone]  Calvin Ryan - 38 y.o. male MRN [id number]  Date of birth: 01-Jul-1980  Visit Date: July 21, 2018  PCP: Calvin Housekeeper, MD   Referred by: Calvin Housekeeper, MD  SUBJECTIVE:  Chief Complaint  Patient presents with  . Follow-up    L knee pain.  Pennsaid and Voltaren previously.  Prior L knee injection - 02/27/18    HPI: Patient is here with recurrent left knee pain.  Symptoms do correlate with lateral IT band syndrome but also along the anterior lateral ankle.  Is worsened with running and he seems to always have exacerbations during races.  He was able to continue running ultra marathon 2 weeks ago and has had almost complete resolution of symptoms at this time but symptoms do tend to flareup recurrently.  NSAIDs have been helpful.  Running up and down hill seems to exacerbate it.  He has been working on tissue mobility this is been helpful.  Minimal improvement following the injection.  This is the same issue that he is been dealing with since his MRI in 2015  REVIEW OF SYSTEMS: No significant nighttime awakenings due to this issue. Denies fevers, chills, recent weight gain or weight loss.  No night sweats.  Pt denies any change in bowel or bladder habits, muscle weakness, numbness or falls associated with this pain. Otherwise 12 point review of systems performed and is negative   HISTORY:  Prior history reviewed and updated per electronic medical record.  Patient Active Problem List   Diagnosis Date Noted  . Left knee pain 07/21/2018    L knee MRI - 03/01/14    . Dyspnea on exertion 08/05/2017  . Cyst of tendon sheath 05/11/2017  . GERD 11/21/2009    Qualifier: Diagnosis of  By: Calvin Mulligan MD, Calvin Ryan     . Attention deficit hyperactivity disorder (ADHD) 06/10/2009   Social History   Occupational History  . Not on file  Tobacco Use  . Smoking status: Former Games developer  .  Smokeless tobacco: Never Used  Substance and Sexual Activity  . Alcohol use: Yes  . Drug use: Not on file  . Sexual activity: Not on file   Social History   Social History Narrative  . Not on file    OBJECTIVE:  VS:  HT:6\' 2"  (188 cm)   WT:217 lb 12.8 oz (98.8 kg)  BMI:27.95    BP:124/76  HR:72bpm  TEMP: ( )  RESP:97 %   PHYSICAL EXAM: Adult male. No acute distress.  Alert and appropriate. Left knee is overall well aligned.  His knee and leg are quite muscular.  He has full flexion-extension of the knee.  He does have a posterior fibular head on the left with poor mobility when compared to the right.  There is slight tissue disruption and tissue disturbance over Gertie's tubercle.  Mild pain over the distal IT band at the lateral femoral condyle.  His most focal area pain is actually over the posterior lateral knee deep to the fibular head likely over the plantaris versus versus lateral gastroc muscles   ASSESSMENT:  1. Chronic pain of left knee     PROCEDURES:  None  PLAN:  Pertinent additional documentation may be included in corresponding procedure notes, imaging studies, problem based documentation and patient instructions.  No problem-specific Assessment & Plan notes found for this encounter.   Long discussion today with him.  His symptoms continue  to recur.  Ultimately the most abnormal finding that we have seen up to date is within the plantaris muscle is seen on ultrasound today.  I believe he continues to have some reactive IT band syndrome but it is likely reflective of the underlying strain and within the plantaris.  Focus therapy in this area is recommended including potential for dry needling and/or manual release.  The fibular head is also posterior and will benefit from ankle work  Continue previously prescribed home exercise program.   Activity modifications and the importance of avoiding exacerbating activities (limiting pain to no more than a 4 / 10 during  or following activity) recommended and discussed.  Discussed red flag symptoms that warrant earlier emergent evaluation and patient voices understanding.  >50% of this 25 minute visit spent in direct patient counseling and/or coordination of care.  Discussion was focused on education regarding the in discussing the pathoetiology and anticipated clinical course of the above condition.   No orders of the defined types were placed in this encounter.  Lab Orders  No laboratory test(s) ordered today    Imaging Orders     [place] [facility] POCT ULTRASOUND Referral Orders  No referral(s) requested today    No follow-ups on file.          Calvin Mews, DO    [facility] Phys[facility]

## 2018-08-03 ENCOUNTER — Telehealth: Payer: Self-pay | Admitting: Internal Medicine

## 2018-08-03 MED ORDER — NITROGLYCERIN 0.2 MG/HR TD PT24: MEDICATED_PATCH | TRANSDERMAL | 1 refills | Status: AC

## 2018-08-03 NOTE — Telephone Encounter (Signed)
RX sent to [facility]

## 2018-08-03 NOTE — Telephone Encounter (Signed)
Copied from CRM [id number]. Topic: Quick Communication - See Telephone Encounter >> Aug 03, 2018 11:25 AM Arlyss Gandy, NT wrote: CRM for notification. See Telephone encounter for: 08/03/18. Pt states that a few weeks ago Dr. Berline Chough offered to call in nitroglycerin patches for him and the pt would like to see if those can be sent in? [id number] [place], [place] - [address] [phone] (Phone) [phone] (Fax)

## 2018-08-03 NOTE — Telephone Encounter (Signed)
Responded to pt via mychart and provided directions for use.

## 2018-08-03 NOTE — Telephone Encounter (Signed)
See note

## 2018-08-14 ENCOUNTER — Ambulatory Visit: Payer: PRIVATE HEALTH INSURANCE | Admitting: Psychology

## 2018-09-25 ENCOUNTER — Ambulatory Visit (INDEPENDENT_AMBULATORY_CARE_PROVIDER_SITE_OTHER): Payer: PRIVATE HEALTH INSURANCE | Admitting: Psychology

## 2018-09-25 DIAGNOSIS — F331 Major depressive disorder, recurrent, moderate: Secondary | ICD-10-CM | POA: Diagnosis not present

## 2018-10-20 ENCOUNTER — Ambulatory Visit (INDEPENDENT_AMBULATORY_CARE_PROVIDER_SITE_OTHER): Payer: PRIVATE HEALTH INSURANCE | Admitting: Psychology

## 2018-10-20 DIAGNOSIS — F331 Major depressive disorder, recurrent, moderate: Secondary | ICD-10-CM | POA: Diagnosis not present

## 2018-11-06 ENCOUNTER — Ambulatory Visit: Payer: PRIVATE HEALTH INSURANCE | Admitting: Psychology

## 2018-12-07 ENCOUNTER — Telehealth: Payer: Self-pay | Admitting: Family Medicine

## 2018-12-07 NOTE — Telephone Encounter (Signed)
Patient called left vm wanting to sch appt w/[facility]. Advised to call back and make appt [phone]

## 2018-12-13 ENCOUNTER — Other Ambulatory Visit: Payer: Self-pay | Admitting: Sports Medicine

## 2018-12-18 ENCOUNTER — Ambulatory Visit: Payer: PRIVATE HEALTH INSURANCE | Admitting: Psychology

## 2019-01-29 ENCOUNTER — Ambulatory Visit (INDEPENDENT_AMBULATORY_CARE_PROVIDER_SITE_OTHER): Payer: PRIVATE HEALTH INSURANCE | Admitting: Psychology

## 2019-01-29 DIAGNOSIS — F331 Major depressive disorder, recurrent, moderate: Secondary | ICD-10-CM | POA: Diagnosis not present

## 2019-02-14 ENCOUNTER — Ambulatory Visit: Payer: Self-pay

## 2019-02-14 ENCOUNTER — Ambulatory Visit (INDEPENDENT_AMBULATORY_CARE_PROVIDER_SITE_OTHER): Payer: PRIVATE HEALTH INSURANCE | Admitting: Family Medicine

## 2019-02-14 ENCOUNTER — Other Ambulatory Visit: Payer: Self-pay

## 2019-02-14 ENCOUNTER — Encounter: Payer: Self-pay | Admitting: Family Medicine

## 2019-02-14 DIAGNOSIS — M79672 Pain in left foot: Secondary | ICD-10-CM | POA: Diagnosis not present

## 2019-02-14 NOTE — Progress Notes (Signed)
Office Visit Note   Patient: Calvin Ryan           Date of Birth: January 28, 1981           MRN: [id number] Visit Date: 02/14/2019 Requested by: Wenda Low, MD [address] [place],  [place] [address] PCP: Wenda Low, MD  Subjective: Chief Complaint  Patient presents with  . Left Foot - Pain    Stomped on a rock while running, about 1 year ago. Continues to have pain in the ball of the foot. Hurts with weightbearing. Hurts to go barefoot for a long period.    HPI: He is here with left foot pain.  About a year ago while running, he stepped awkwardly on a rock and had pain immediately at the third MTP joint.  He was able to continue to run another hour and a half.  It hurt for a while but he was able to continue running intermittently.  Then he took about 3 months off, he was pain-free during that time, but then when he started running again the pain came right back.  It does not bother him with day-to-day activities.  He has tried therapeutic ultrasound under physical therapy guidance.  He gets temporary relief with that.  He has tried nitroglycerin patch therapy but not consistently.  No history of stress fractures.  He is otherwise been in excellent health.              ROS:   All other systems were reviewed and are negative.  Objective: Vital Signs: There were no vitals taken for this visit.  Physical Exam:  General:  Alert and oriented, in no acute distress. Pulm:  Breathing unlabored. Psy:  Normal mood, congruent affect. Skin: No rash, bruising, erythema. Left foot: He has no visible swelling, no warmth to touch.  Flexor and extensor tendon functions are intact.  He is tender to palpation on the medial side of his distal third metatarsal near the MTP joint.  No pain with medial/lateral squeeze of the metatarsal heads.  No significant tenderness from the plantar approach.  Imaging: X-rays left foot: He has short first ray with second metatarsal head flattening  and bony spurring but still a good joint space.  Third MTP joint looks normal overall, no evidence of previous fracture.  Limited diagnostic ultrasound left foot: He has a joint effusion visible dorsally at the third MTP joint.  There is hyperemia on power Doppler imaging.  There is a dorsal bony spur at the distal third metatarsal but no obvious fracture.  No visible neuroma.   Assessment & Plan: 1.  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 -He will try iontophoresis in physical therapy.  Voltaren gel topically.  If symptoms persist, consider prolotherapy with dextrose or possibly a one-time cortisone injection.     Procedures: No procedures performed  No notes on file     PMFS History: Patient Active Problem List   Diagnosis Date Noted  . Left knee pain 07/21/2018  . Dyspnea on exertion 08/05/2017  . Cyst of tendon sheath 05/11/2017  . GERD 11/21/2009  . Attention deficit hyperactivity disorder (ADHD) 06/10/2009   Past Medical History:  Diagnosis Date  . ADHD (attention deficit hyperactivity disorder)     Family History  Problem Relation Age of Onset  . Hyperlipidemia Mother   . Hypertension Mother   . Hyperlipidemia Father   . Hypertension Father     Past Surgical History:  Procedure  Laterality Date  . HERNIA REPAIR     Social History   Occupational History  . Not on file  Tobacco Use  . Smoking status: Former Games developermoker  . Smokeless tobacco: Never Used  Substance and Sexual Activity  . Alcohol use: Yes  . Drug use: Not on file  . Sexual activity: Not on file

## 2019-02-22 ENCOUNTER — Ambulatory Visit: Payer: PRIVATE HEALTH INSURANCE | Attending: Family Medicine

## 2019-02-22 ENCOUNTER — Other Ambulatory Visit: Payer: Self-pay

## 2019-02-22 DIAGNOSIS — M79672 Pain in left foot: Secondary | ICD-10-CM | POA: Diagnosis not present

## 2019-02-22 NOTE — Therapy (Signed)
[facility] [address] [place], [place], [address] Phone: [phone]   Fax:  [phone]  Physical Therapy Evaluation  Patient Details  Name: Calvin Ryan MRN: [id number] Date of Birth: 1981-01-12 Referring Provider (PT): Dr. Prince RomeHilts   Encounter Date: 02/22/2019  PT End of Session - 02/26/19 0920    Visit Number  1    Number of Visits  17    Date for PT Re-Evaluation  04/19/19    PT Start Time  1600    PT Stop Time  1700    PT Time Calculation (min)  60 min    Activity Tolerance  Patient tolerated treatment well    Behavior During Therapy  [facility] for tasks assessed/performed       Past Medical History:  Diagnosis Date  . ADHD (attention deficit hyperactivity disorder)     Past Surgical History:  Procedure Laterality Date  . HERNIA REPAIR      There were no vitals filed for this visit.   Subjective Assessment - 02/26/19 0931    Subjective  L foot pain    Pertinent History  Pt is referred is here with left foot pain secondary to third MTP synovitis/metatarsalgia. About a year ago while running, he stepped awkwardly on a rock and had pain immediately at the third MTP joint.  He was able to continue to run another hour and a half.  It hurt for a while but he was able to continue running intermittently.  Then he took about 3 months off and pain was significantly improved. He started running again the pain came right back as well as swelling. When he gets swelling at the third MTP he gets some mild numbness just distal to the third MTP. No history of gout. It does not bother him with day-to-day activities when wearing supportive well-cushioned footwear. Pt has tried therapeutic ultrasound under physical therapy guidance and he is able to get full range of motion without pain. He has also tried estim without relief. He tried trigger point dry needling with improvement but then stopped due to concerns of some nerve related  pain. He has tried nitroglycerin patch therapy but did not use consistently enough to notice improvement. No history of stress fractures.  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 L ankle sprain multiple years ago but no current pain. History of L lateral knee pain 2013 which continues to bother him intermittently. He had a L knee MRI which was unremarkable. He has had multiple MSK ultrasound of his L knee which showed a plantaris tear and popliteus tear. No L hip issues. No history of back pain.    Limitations  Walking    How long can you walk comfortably?  Pt can run for about 6 miles currently    Diagnostic tests  Plain film radiographs and MSK ultrasound    Currently in Pain?  No/denies    Pain Score  0-No pain   Best: 0/10, Worst: 5/10   Pain Location  Toe (Comment which one)    Pain Orientation  Left    Pain Descriptors / Indicators  Aching;Sharp    Pain Type  Chronic pain    Pain Onset  More than a month ago    Pain Frequency  Intermittent    Aggravating Factors   running    Pain Relieving Factors  Ibuprofen prn, no change with voltaren topical    Multiple  Pain Sites  No         SUBJECTIVE  Chief complaint:  History: Pt is referred is here with left foot pain secondary to third MTP synovitis/metatarsalgia. About a year ago while running, he stepped awkwardly on a rock and had pain immediately at the third MTP joint.  He was able to continue to run another hour and a half.  It hurt for a while but he was able to continue running intermittently.  Then he took about 3 months off and pain was significantly improved. He started running again the pain came right back as well as swelling. When he gets swelling at the third MTP he gets some mild numbness just distal to the third MTP. No history of gout. It does not bother him with day-to-day activities when wearing supportive well-cushioned footwear. Pt has tried therapeutic ultrasound under  physical therapy guidance and he is able to get full range of motion without pain. He has also tried estim without relief. He tried trigger point dry needling with improvement but then stopped due to concerns of some nerve related pain. He has tried nitroglycerin patch therapy but did not use consistently enough to notice improvement. No history of stress fractures.  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 L ankle sprain multiple years ago but no current pain. History of L lateral knee pain 2013 which continues to bother him intermittently. He had a L knee MRI which was unremarkable. He has had multiple MSK ultrasound of his L knee which showed a plantaris tear and popliteus tear. No L hip issues. No history of back pain.  Referring Dx: L foot pain Referring Provider: Dr. Prince RomeHilts Onset Date/Surgical Date: Approximately 1 year ago Hand Dominance: Right Next MD Visit: None scheduled Prior therapy for ankle: Yes  Pain frequency: Intermittently especially with running Pain quality: pain quality: aching, sharp with end range flexion/extension Radiating pain: No  Pain Score: Present 0/10, Best 0/10, Worst 5/10 Aggravating factors: After running Easing factors: Ibuprofen prn, no change with voltaren 24 hour pain behavior: More painful in the AM Imaging: Yes  Plain film L foot: He has short first ray with second metatarsal head flattening and bony spurring but still a good joint space.  Third MTP joint looks normal overall, no evidence of previous fracture. Diagnostic ultrasound: Limited diagnostic ultrasound left foot: He has a joint effusion visible dorsally at the third MTP joint. There is hyperemia on power Doppler imaging.  There is a dorsal bony spur at the distal third metatarsal but no obvious fracture.  No visible neuroma. Prior history of ankle injury, surgery, or pain: No History of back, hip, or knee pain: Yes. See abpve Red Flags  (fever/chills, dysarthria, dysphagia, bowel and bladder changes, recent weight loss/gain, personal history of cancer, night pain): No   OBJECTIVE  MUSCULOSKELETAL: Tremor: Absent Bulk: Normal Tone: Normal, no clonus No trophic changes noted to foot/ankle. No ecchymosis, erythema, or edema noted. No gross ankle/foot/toe deformity noted  Lumbar/Hip/Knee Screen AROM: Lumbar spine AROM is full and painless in all directions with overpressure. L hip and knee PROM is WFL and painless in all planes with overpressure. Hip scour negative. FABER/FADIR negative.   Posture No gross abnormalities noted in seated or standing posture  Gait No excessive pronation/supination noted during gait. Mild medial whip noted during ambulation bilaterally. Excessive toe extension in all toes noted bilaterally during gait.   Palpation No pain to palpation along medial and  lateral malleoli. No pain over anterior or posterior ankle. Achilles tendon intact and painless to palpation. No pain over midfoot or near base of 5th metatarsal. Pt has some pain with deep palpation over dorsal aspect of third MTP. No pain with palpation on ventral side of third MTP. Mild pain with deep palpation of webspace between second and third MTP  Strength R/L 5/5 Hip flexion 5/5 Hip external rotation 5/5 Hip internal rotation 5/5 Knee extension 5/5 Knee flexion 5/5 Ankle Dorsiflexion Composite toe flexion and extension is strong and painless. Pt reports some mild pain with isolated resisted MTP flexion of third digit.  AROM/PROM L hip flexion and ER/IR grossly WFL passively. Full L knee flexion and extension with overpressure and no pain. L ankle PROM DF/PF appears grossly WNL. No gross deficits or pain noted in inversion/eversion PROM of L ankle. Mild loss of end range extension in L third MTP with pain during overpressure. Full PROM L third MTP flexion with mild pain also at end range. 3rd PIP and DIP PROM is grossly WNL and  painless. No gross deficits noted in PROM of 1st, 2nd, 4th, or 5th MTP, PIP, and DIP.   Passive Accessory Motion L Inferior Tibiofibular Joint: WNL/painless L Talocrural Joint Distraction: WNL/painless L Talocrural Joint AP: WNL/painless L Talocrural Joint PA: WNL/painless L subtalar joint medial to lateral: WNL/painless L subtalar joint lateral to medial: WNL/painless L metatarsal AP/PA mobilizations WNL/painless   L third MTP AP/PA mobilization WNL/painless L third PIP and DIP AP/PA mobilizations WNL/painless   NEUROLOGICAL:  Mental Status Patient is oriented to person, place and time.  Recent memory is intact.  Remote memory is intact.  Attention span and concentration are intact.  Expressive speech is intact.  Patient's fund of knowledge is within normal limits for educational level.  Sensation Grossly intact to light touch bilateral LEs as determined by testing dermatomes L2-S2 Proprioception and hot/cold testing deferred on this date  Reflexes Deferred   VASCULAR Dorsalis pedis and posterior tibial pulses are not assessed at this time   SPECIAL TESTS  Ligamentous Integrity Anterior Drawer (ATF, 10-15 plantarflexion with anterior translation): Negative Talar Tilt (CFL, inversion): Negative Eversion Stress Test (Deltoid, eversion): Negative External Rotation Test (High ankle, dorsiflexion and external rotation): Negative Squeeze Test (High ankle): Not examined Impingment Sign (Dorsiflexion and eversion): Not examined  Achilles Integrity Thompson Test: Not examined  Fracture Screening Metatarsal Axial Loading: Not examined Tap/Percussion Test: Not examined Vibration Test: Not examined  Pronation/Supination Navicular Drop: Not examined  Nerve Test Tarsal Tunnel Test (maximal DF, EV, toe ext with tapping over tarsal tunnel): Negative Test for Morton's Neuroma (compress metatarsals and mobilize): Not examined  Other Windlass Mechanism Test: Not  examined     [facility] PT Assessment - 02/26/19 0916      Assessment   Medical Diagnosis  L foot pain    Referring Provider (PT)  Dr. Prince Rome    Onset Date/Surgical Date  02/22/18   Approximate   Hand Dominance  Right    Next MD Visit  None scheduled    Prior Therapy  Therapeutic ultrasound      Precautions   Precautions  None      Restrictions   Weight Bearing Restrictions  No      Balance Screen   Has the patient fallen in the past 6 months  No    Has the patient had a decrease in activity level because of a fear of falling?   No    Is the  patient reluctant to leave their home because of a fear of falling?   No      Home Environment   Living Environment  Private residence    Living Arrangements  Spouse/significant other    Available Help at Discharge  Family    Type of [place]      Prior Function   Level of Independence  Independent    Vocation  Full time employment    Vocation Requirements  Full time PT    Leisure  Ultra-marathons and long-distance running      Cognition   Overall Cognitive Status  Within Functional Limits for tasks assessed      Observation/Other Assessments   Other Surveys   Lower Extremity Functional Scale    Lower Extremity Functional Scale   68/80                Objective measurements completed on examination: See above findings.      TREATMENT   IONTOPHORESIS Iontophoresis with 1.0 mL of dexamethasone (4mg /mL) applied to dorsal aspect of L third metatarsal at dose of 40 mA x min, utilized current of 2.0 mA for 20 minutes. Pt encouraged to wear patch for an additional hour afterwards and then remove. Inspect skin for any irritation.           PT Short Term Goals - 02/26/19 1032      PT SHORT TERM GOAL #1   Title  Pt will be independent with HEP in order to decrease 3rd MTP pain in order to improve pain-free distance running    Time  4    Period  Weeks    Status  New    Target Date  03/22/19        PT Long  Term Goals - 02/26/19 1033      PT LONG TERM GOAL #1   Title  Pt will increase LEFS by at least 9 points in order to demonstrate significant improvement in lower extremity function.    Baseline  02/22/19: 68/80    Time  8    Period  Weeks    Status  New    Target Date  04/19/19      PT LONG TERM GOAL #2   Title  Pt will decrease worst pain as reported on NPRS by at least 3 points in order to demonstrate clinically significant reduction in ankle/foot pain.    Baseline  02/22/19: 5/10    Time  8    Period  Weeks    Status  New    Target Date  04/19/19             Plan - 02/26/19 1034    Clinical Impression Statement  Pt is a pleasant 39 year-old male referral for synovitis/metatarsalgia at right 3rd MTP joint. PT examination reveals normal and painless screen of L hip, knee and ankle. All ligamentous laxity testing is negative. No trophic changes noted to foot/ankle. No ecchymosis, erythema, or edema noted. No gross ankle/foot/toe deformity. He does report some pain with deep palpation over dorsal aspect of third MTP. No pain with palpation on ventral side of third MTP. Mild pain with deep palpation of webspace between second and third MTP as well. Pt reports some mild pain with isolated resisted MTP flexion of third digit. Mild loss of end range extension in L third MTP with pain during overpressure. Full PROM L third MTP flexion with mild pain also at end range as well. 3rd PIP  and DIP PROM is grossly WNL and painless. No gross deficits noted in PROM of 1st, 2nd, 4th, or 5th MTP, PIP, and DIP. All metatarsal passive accessory testing is normal and painless. Utilized iontophoresis with dexamethasone today as indicated. Pt will benefit from PT services to address deficits in L third MTP pain in order to resume full distance running without an increase in third MTP pain.    Personal Factors and Comorbidities  Time since onset of injury/illness/exacerbation;Past/Current Experience     Examination-Activity Limitations  Other   Running   Stability/Clinical Decision Making  Stable/Uncomplicated    Clinical Decision Making  Low    Rehab Potential  Good    PT Frequency  2x / week    PT Duration  8 weeks    PT Treatment/Interventions  Aquatic Therapy;Canalith Repostioning;Electrical Stimulation;Cryotherapy;Iontophoresis 4mg /ml Dexamethasone;Moist Heat;Traction;Ultrasound;Parrafin;Fluidtherapy;Contrast Bath;DME Instruction;Gait training;Stair training;Functional mobility training;Therapeutic activities;Therapeutic exercise;Balance training;Neuromuscular re-education;Patient/family education;Manual techniques;Dry needling;Splinting;Taping;Vestibular;Joint Manipulations    PT Next Visit Plan  Iontophoresis with dexamethasone, check metatarsal loading, impingement sign, test for Morton's neuroma, tarsal tunnel, consider TDN for FDL, FDB, interossei, and quadratus plantae;    PT Home Exercise Plan  None currently    Consulted and Agree with Plan of Care  Patient       Patient will benefit from skilled therapeutic intervention in order to improve the following deficits and impairments:  Pain  Visit Diagnosis: Pain in left foot     Problem List Patient Active Problem List   Diagnosis Date Noted  . Left knee pain 07/21/2018  . Dyspnea on exertion 08/05/2017  . Cyst of tendon sheath 05/11/2017  . GERD 11/21/2009  . Attention deficit hyperactivity disorder (ADHD) 06/10/2009   Lynnea Maizes PT, DPT, GCS  Peachie Barkalow 02/26/2019, 10:45 AM  [facility] MAIN [facility] SERVICES [address] [place], [place], [address] Phone: [phone]   Fax:  [phone]  Name: Calvin Ryan MRN: [id number] Date of Birth: 1980/12/29

## 2019-02-23 ENCOUNTER — Ambulatory Visit: Payer: PRIVATE HEALTH INSURANCE

## 2019-02-27 ENCOUNTER — Ambulatory Visit: Payer: PRIVATE HEALTH INSURANCE

## 2019-02-27 ENCOUNTER — Other Ambulatory Visit: Payer: Self-pay

## 2019-02-27 DIAGNOSIS — M79672 Pain in left foot: Secondary | ICD-10-CM

## 2019-02-27 NOTE — Therapy (Signed)
[place] MAIN [facility] SERVICES [address] [place], [place], [address] Phone: [phone]   Fax:  [phone]  Physical Therapy Treatment  Patient Details  Name: Calvin Ryan MRN: [id number] Date of Birth: 06-Jun-1981 Referring Provider (PT): Dr. Junius Roads   Encounter Date: 02/27/2019  PT End of Session - 02/27/19 1122    Visit Number  2    Number of Visits  17    Date for PT Re-Evaluation  04/19/19    PT Start Time  1100    PT Stop Time  1120    PT Time Calculation (min)  20 min    Activity Tolerance  Patient tolerated treatment well    Behavior During Therapy  [facility] for tasks assessed/performed       Past Medical History:  Diagnosis Date  . ADHD (attention deficit hyperactivity disorder)     Past Surgical History:  Procedure Laterality Date  . HERNIA REPAIR      There were no vitals filed for this visit.  Subjective Assessment - 02/27/19 1121    Subjective  Pt reports that he is doing well today. He states that he feels like his 3rd MTP pain is improving slightly. He ran Saturday morning and did not have any notable swelling or pain immediately following or for the rest of the day. No specific questions or concerns at this time.    Pertinent History  Pt is referred is here with left foot pain secondary to third MTP synovitis/metatarsalgia. About a year ago while running, he stepped awkwardly on a rock and had pain immediately at the third MTP joint.  He was able to continue to run another hour and a half.  It hurt for a while but he was able to continue running intermittently.  Then he took about 3 months off and pain was significantly improved. He started running again the pain came right back as well as swelling. When he gets swelling at the third MTP he gets some mild numbness just distal to the third MTP. No history of gout. It does not bother him with day-to-day activities when wearing supportive well-cushioned footwear. Pt has tried  therapeutic ultrasound under physical therapy guidance and he is able to get full range of motion without pain. He has also tried estim without relief. He tried trigger point dry needling with improvement but then stopped due to concerns of some nerve related pain. He has tried nitroglycerin patch therapy but did not use consistently enough to notice improvement. No history of stress fractures.  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 L ankle sprain multiple years ago but no current pain. History of L lateral knee pain 2013 which continues to bother him intermittently. He had a L knee MRI which was unremarkable. He has had multiple MSK ultrasound of his L knee which showed a plantaris tear and popliteus tear. No L hip issues. No history of back pain.    Limitations  Walking    How long can you walk comfortably?  Pt can run for about 6 miles currently    Diagnostic tests  Plain film radiographs and MSK ultrasound    Currently in Pain?  No/denies           TREATMENT   IONTOPHORESIS Iontophoresis with 1.0 mL of dexamethasone (4mg /mL) applied to dorsal aspect of L third metatarsal at dose of 40 mA x min, utilized current of 2.0 mA for 20:16 minutes.  Pt encouraged to wear patch for an additional hour afterwards and then remove. Inspect skin for any irritation.     Pt denies any resting pain upon arrival and believes that his 3rd MTP pain is improving slightly. Offered to attempt some dry needling to flexor digitorum longus, flexor digitorum brevis, or interossei however pt declines at this time. Iontophoresis utilized again today during therapy session. Pt encouraged to follow-up as scheduled. Pt will benefit from PT services to address deficits in 3rd MTP pain in order to resume pain-free running.                    PT Short Term Goals - 02/26/19 1032      PT SHORT TERM GOAL #1   Title  Pt will be independent with HEP in  order to decrease 3rd MTP pain in order to improve pain-free distance running    Time  4    Period  Weeks    Status  New    Target Date  03/22/19        PT Long Term Goals - 02/26/19 1033      PT LONG TERM GOAL #1   Title  Pt will increase LEFS by at least 9 points in order to demonstrate significant improvement in lower extremity function.    Baseline  02/22/19: 68/80    Time  8    Period  Weeks    Status  New    Target Date  04/19/19      PT LONG TERM GOAL #2   Title  Pt will decrease worst pain as reported on NPRS by at least 3 points in order to demonstrate clinically significant reduction in ankle/foot pain.    Baseline  02/22/19: 5/10    Time  8    Period  Weeks    Status  New    Target Date  04/19/19            Plan - 02/27/19 1122    Clinical Impression Statement  Pt denies any resting pain upon arrival and believes that his 3rd MTP pain is improving slightly. Offered to attempt some dry needling to flexor digitorum longus, flexor digitorum brevis, or interossei however pt declines at this time. Iontophoresis utilized again today during therapy session. Pt encouraged to follow-up as scheduled. Pt will benefit from PT services to address deficits in 3rd MTP pain in order to resume pain-free running.    Personal Factors and Comorbidities  Time since onset of injury/illness/exacerbation;Past/Current Experience    Examination-Activity Limitations  Other   Running   Stability/Clinical Decision Making  Stable/Uncomplicated    Rehab Potential  Good    PT Frequency  2x / week    PT Duration  8 weeks    PT Treatment/Interventions  Aquatic Therapy;Canalith Repostioning;Electrical Stimulation;Cryotherapy;Iontophoresis 4mg /ml Dexamethasone;Moist Heat;Traction;Ultrasound;Parrafin;Fluidtherapy;Contrast Bath;DME Instruction;Gait training;Stair training;Functional mobility training;Therapeutic activities;Therapeutic exercise;Balance training;Neuromuscular re-education;Patient/family  education;Manual techniques;Dry needling;Splinting;Taping;Vestibular;Joint Manipulations    PT Next Visit Plan  Iontophoresis with dexamethasone, check metatarsal loading, impingement sign, test for Morton's neuroma, tarsal tunnel, consider TDN for FDL, FDB, interossei, and quadratus plantae;    PT Home Exercise Plan  None currently    Consulted and Agree with Plan of Care  Patient       Patient will benefit from skilled therapeutic intervention in order to improve the following deficits and impairments:  Pain  Visit Diagnosis: Pain in left foot     Problem List Patient Active Problem List   Diagnosis Date  Noted  . Left knee pain 07/21/2018  . Dyspnea on exertion 08/05/2017  . Cyst of tendon sheath 05/11/2017  . GERD 11/21/2009  . Attention deficit hyperactivity disorder (ADHD) 06/10/2009   Lynnea Maizes PT, DPT, GCS  Aela Bohan 02/27/2019, 1:59 PM  [place] [facility] MAIN [facility] SERVICES [address] [place], [place], [address] Phone: [phone]   Fax:  [phone]  Name: Calvin Ryan MRN: [id number] Date of Birth: 1981-04-20

## 2019-03-01 ENCOUNTER — Other Ambulatory Visit: Payer: Self-pay

## 2019-03-01 ENCOUNTER — Ambulatory Visit: Payer: PRIVATE HEALTH INSURANCE

## 2019-03-01 DIAGNOSIS — M79672 Pain in left foot: Secondary | ICD-10-CM | POA: Diagnosis not present

## 2019-03-01 NOTE — Therapy (Signed)
[facility] MAIN [facility] SERVICES [address] [place], [place], [address] Phone: [phone]   Fax:  [phone]  Physical Therapy Treatment  Patient Details  Name: Calvin Ryan MRN: [id number] Date of Birth: [birth year] Referring Provider (PT): Dr. Prince Rome   Encounter Date: 03/01/2019  PT End of Session - 03/01/19 1721    Visit Number  3    Number of Visits  17    Date for PT Re-Evaluation  04/19/19    PT Start Time  1630    PT Stop Time  1650    PT Time Calculation (min)  20 min    Activity Tolerance  Patient tolerated treatment well    Behavior During Therapy  [facility] for tasks assessed/performed       Past Medical History:  Diagnosis Date  . ADHD (attention deficit hyperactivity disorder)     Past Surgical History:  Procedure Laterality Date  . HERNIA REPAIR      There were no vitals filed for this visit.  Subjective Assessment - 03/01/19 1720    Subjective  Pt reports that he is doing well today but has noticed a little swelling in the dorsal aspect of his 3rd MTP. He reports that he ran this morning and didn't have any pain during or following. He has not had any issues today with walking at work. No pain currently. No specific questions at this time.    Pertinent History  Pt is referred is here with left foot pain secondary to third MTP synovitis/metatarsalgia. About a year ago while running, he stepped awkwardly on a rock and had pain immediately at the third MTP joint.  He was able to continue to run another hour and a half.  It hurt for a while but he was able to continue running intermittently.  Then he took about 3 months off and pain was significantly improved. He started running again the pain came right back as well as swelling. When he gets swelling at the third MTP he gets some mild numbness just distal to the third MTP. No history of gout. It does not bother him with day-to-day activities when wearing supportive well-cushioned  footwear. Pt has tried therapeutic ultrasound under physical therapy guidance and he is able to get full range of motion without pain. He has also tried estim without relief. He tried trigger point dry needling with improvement but then stopped due to concerns of some nerve related pain. He has tried nitroglycerin patch therapy but did not use consistently enough to notice improvement. No history of stress fractures.  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 L ankle sprain multiple years ago but no current pain. History of L lateral knee pain 2013 which continues to bother him intermittently. He had a L knee MRI which was unremarkable. He has had multiple MSK ultrasound of his L knee which showed a plantaris tear and popliteus tear. No L hip issues. No history of back pain.    Limitations  Walking    How long can you walk comfortably?  Pt can run for about 6 miles currently    Diagnostic tests  Plain film radiographs and MSK ultrasound    Currently in Pain?  No/denies         TREATMENT   IONTOPHORESIS Iontophoresis with 1.0 mL of dexamethasone (4mg /mL) applied to dorsal aspect of L third metatarsal at dose of 40 mA x min, utilized current of 2.0  mA for 20:16 minutes. Pt encouraged to wear patch for an additional hour afterwards and then remove. Inspect skin for any irritation.   Pt denies any resting pain upon arrival and was able to run this morning without any pain. Metatarsal loading and Morton's neuroma testing is negative. Pt remains slightly tender with deep palpation of 3rd MTP from both dorsal and ventral sides. Iontophoresis utilized again today during therapy session. Pt encouraged to follow-up as scheduled. Pt will benefit from PT services to address deficits in 3rd MTP pain in order to resume pain-free running.                          PT Short Term Goals - 02/26/19 1032      PT SHORT TERM GOAL #1   Title   Pt will be independent with HEP in order to decrease 3rd MTP pain in order to improve pain-free distance running    Time  4    Period  Weeks    Status  New    Target Date  03/22/19        PT Long Term Goals - 02/26/19 1033      PT LONG TERM GOAL #1   Title  Pt will increase LEFS by at least 9 points in order to demonstrate significant improvement in lower extremity function.    Baseline  02/22/19: 68/80    Time  8    Period  Weeks    Status  New    Target Date  04/19/19      PT LONG TERM GOAL #2   Title  Pt will decrease worst pain as reported on NPRS by at least 3 points in order to demonstrate clinically significant reduction in ankle/foot pain.    Baseline  02/22/19: 5/10    Time  8    Period  Weeks    Status  New    Target Date  04/19/19            Plan - 03/01/19 1721    Clinical Impression Statement  Pt denies any resting pain upon arrival and was able to run this morning without any pain. Metatarsal loading and Morton's neuroma testing is negative. Pt remains slightly tender with deep palpation of 3rd MTP from both dorsal and ventral sides. Iontophoresis utilized again today during therapy session. Pt encouraged to follow-up as scheduled. Pt will benefit from PT services to address deficits in 3rd MTP pain in order to resume pain-free running.    Personal Factors and Comorbidities  Time since onset of injury/illness/exacerbation;Past/Current Experience    Examination-Activity Limitations  Other   Running   Stability/Clinical Decision Making  Stable/Uncomplicated    Rehab Potential  Good    PT Frequency  2x / week    PT Duration  8 weeks    PT Treatment/Interventions  Aquatic Therapy;Canalith Repostioning;Electrical Stimulation;Cryotherapy;Iontophoresis 4mg /ml Dexamethasone;Moist Heat;Traction;Ultrasound;Parrafin;Fluidtherapy;Contrast Bath;DME Instruction;Gait training;Stair training;Functional mobility training;Therapeutic activities;Therapeutic exercise;Balance  training;Neuromuscular re-education;Patient/family education;Manual techniques;Dry needling;Splinting;Taping;Vestibular;Joint Manipulations    PT Next Visit Plan  Iontophoresis with dexamethasone, tarsal tunnel, consider TDN for FDL, FDB, interossei, and quadratus plantae;    PT Home Exercise Plan  None currently    Consulted and Agree with Plan of Care  Patient       Patient will benefit from skilled therapeutic intervention in order to improve the following deficits and impairments:  Pain  Visit Diagnosis: Pain in left foot     Problem List Patient Active Problem  List   Diagnosis Date Noted  . Left knee pain 07/21/2018  . Dyspnea on exertion 08/05/2017  . Cyst of tendon sheath 05/11/2017  . GERD 11/21/2009  . Attention deficit hyperactivity disorder (ADHD) 06/10/2009   Phillips Grout PT, DPT, GCS  Brandalynn Ofallon 03/01/2019, 5:24 PM  [place] MAIN [facility] SERVICES [address] [place], [place], [address] Phone: [phone]   Fax:  [phone]  Name: Calvin Ryan MRN: [id number] Date of Birth: 05-Jul-1980

## 2019-03-06 ENCOUNTER — Ambulatory Visit: Payer: PRIVATE HEALTH INSURANCE

## 2019-03-06 ENCOUNTER — Other Ambulatory Visit: Payer: Self-pay

## 2019-03-06 DIAGNOSIS — M79672 Pain in left foot: Secondary | ICD-10-CM | POA: Diagnosis not present

## 2019-03-06 NOTE — Therapy (Signed)
[place] [facility] PHYSICAL AND SPORTS MEDICINE [address] , [place], [address] Phone: [phone]   Fax:  [phone]  Physical Therapy Treatment  Patient Details  Name: Calvin Ryan C Petite MRN: [id number] Date of Birth: 1981-01-10 Referring Provider (PT): Dr. Prince RomeHilts   Encounter Date: 03/06/2019  PT End of Session - 03/06/19 1511    Visit Number  4    Number of Visits  17    Date for PT Re-Evaluation  04/19/19    PT Start Time  1455    PT Stop Time  1513    PT Time Calculation (min)  18 min    Activity Tolerance  Patient tolerated treatment well    Behavior During Therapy  [facility]       Past Medical History:  Diagnosis Date  . ADHD (attention deficit hyperactivity disorder)     Past Surgical History:  Procedure Laterality Date  . HERNIA REPAIR      There were no vitals filed for this visit.  Subjective Assessment - 03/06/19 1506    Subjective  Patient states increased pain with ambulating out of the door and less pain during his run today. Patient does not report any significant increase in pain during his run.    Pertinent History  Pt is referred is here with left foot pain secondary to third MTP synovitis/metatarsalgia. About a year ago while running, he stepped awkwardly on a rock and had pain immediately at the third MTP joint.  He was able to continue to run another hour and a half.  It hurt for a while but he was able to continue running intermittently.  Then he took about 3 months off and pain was significantly improved. He started running again the pain came right back as well as swelling. When he gets swelling at the third MTP he gets some mild numbness just distal to the third MTP. No history of gout. It does not bother him with day-to-day activities when wearing supportive well-cushioned footwear. Pt has tried therapeutic ultrasound under physical therapy guidance and he is able to get full range of motion  without pain. He has also tried estim without relief. He tried trigger point dry needling with improvement but then stopped due to concerns of some nerve related pain. He has tried nitroglycerin patch therapy but did not use consistently enough to notice improvement. No history of stress fractures.  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 L ankle sprain multiple years ago but no current pain. History of L lateral knee pain 2013 which continues to bother him intermittently. He had a L knee MRI which was unremarkable. He has had multiple MSK ultrasound of his L knee which showed a plantaris tear and popliteus tear. No L hip issues. No history of back pain.    Limitations  Walking    How long can you walk comfortably?  Pt can run for about 6 miles currently    Diagnostic tests  Plain film radiographs and MSK ultrasound    Currently in Pain?  No/denies       IONTOPHORESIS Iontophoresis with 2.0 mL of dexamethasone (4mg /mL) applied to plantar aspect of L third metatarsal at dose of 40 mA x min, utilized current of 3.0 mA for 15 minutes.Encouraged patient to inspect skin for irritation.Checked skin for irritation, no visual irritation noted. Patient positioned in long sitting during today's session.     Pt denies any  resting pain upon arrival and at the end of the session. Pt will benefit from PT services to address deficits in 3rd MTP pain in order to resume pain-free running.       PT Education - 03/06/19 1511    Education Details  plan of care, iontophoresis    Person(s) Educated  Patient    Methods  Explanation;Demonstration    Comprehension  Verbalized understanding;Returned demonstration       PT Short Term Goals - 02/26/19 1032      PT SHORT TERM GOAL #1   Title  Pt will be independent with HEP in order to decrease 3rd MTP pain in order to improve pain-free distance running    Time  4    Period  Weeks    Status  New    Target  Date  03/22/19        PT Long Term Goals - 02/26/19 1033      PT LONG TERM GOAL #1   Title  Pt will increase LEFS by at least 9 points in order to demonstrate significant improvement in lower extremity function.    Baseline  02/22/19: 68/80    Time  8    Period  Weeks    Status  New    Target Date  04/19/19      PT LONG TERM GOAL #2   Title  Pt will decrease worst pain as reported on NPRS by at least 3 points in order to demonstrate clinically significant reduction in ankle/foot pain.    Baseline  02/22/19: 5/10    Time  8    Period  Weeks    Status  New    Target Date  04/19/19            Plan - 03/06/19 1512    Clinical Impression Statement  Patient demonstrates increased swelling along the plantar aspect of hisL  foot most notably along the 2-3rd digits. Utilized iontophoresis during today's session in line with plan of care, applied to the plantar aspect of the foot compared to the dorsum. Patient reports slight improvement in symptoms after performing iontophoresis indicating decrease in inflammation and improvement in mobility. Patient will benefit from further skilled therapy to return to prior level of function.    Personal Factors and Comorbidities  Time since onset of injury/illness/exacerbation;Past/Current Experience    Examination-Activity Limitations  Other   Running   Stability/Clinical Decision Making  Stable/Uncomplicated    Rehab Potential  Good    PT Frequency  2x / week    PT Duration  8 weeks    PT Treatment/Interventions  Aquatic Therapy;Canalith Repostioning;Electrical Stimulation;Cryotherapy;Iontophoresis 4mg /ml Dexamethasone;Moist Heat;Traction;Ultrasound;Parrafin;Fluidtherapy;Contrast Bath;DME Instruction;Gait training;Stair training;Functional mobility training;Therapeutic activities;Therapeutic exercise;Balance training;Neuromuscular re-education;Patient/family education;Manual techniques;Dry needling;Splinting;Taping;Vestibular;Joint Manipulations     PT Next Visit Plan  Iontophoresis with dexamethasone, tarsal tunnel, consider TDN for FDL, FDB, interossei, and quadratus plantae;    PT Home Exercise Plan  None currently    Consulted and Agree with Plan of Care  Patient       Patient will benefit from skilled therapeutic intervention in order to improve the following deficits and impairments:  Pain  Visit Diagnosis: Pain in left foot     Problem List Patient Active Problem List   Diagnosis Date Noted  . Left knee pain 07/21/2018  . Dyspnea on exertion 08/05/2017  . Cyst of tendon sheath 05/11/2017  . GERD 11/21/2009  . Attention deficit hyperactivity disorder (ADHD) 06/10/2009    Blythe Stanford, PT DPT 03/06/2019,  3:20 PM  [place] [facility] PHYSICAL AND SPORTS MEDICINE [address], [place], [address] Phone: [phone]   Fax:  [phone]  Name: ADD DINAPOLI MRN: [id number] Date of Birth: Feb 25, 1981

## 2019-03-08 ENCOUNTER — Ambulatory Visit: Payer: PRIVATE HEALTH INSURANCE | Attending: Family Medicine | Admitting: Physical Therapy

## 2019-03-08 ENCOUNTER — Encounter: Payer: Self-pay | Admitting: Physical Therapy

## 2019-03-08 ENCOUNTER — Other Ambulatory Visit: Payer: Self-pay

## 2019-03-08 DIAGNOSIS — M79672 Pain in left foot: Secondary | ICD-10-CM | POA: Insufficient documentation

## 2019-03-08 NOTE — Therapy (Signed)
[place] PHYSICAL AND SPORTS MEDICINE [address] [place], [address] Phone: [phone]   Fax:  [phone]  Physical Therapy Treatment  Patient Details  Name: Calvin Ryan MRN: [id number] Date of Birth: [birth year] Referring Provider (PT): Dr. Junius Roads   Encounter Date: 03/08/2019  PT End of Session - 03/08/19 1609    Visit Number  5    Number of Visits  17    Date for PT Re-Evaluation  04/19/19    PT Start Time  6160    PT Stop Time  1543    PT Time Calculation (min)  28 min    Activity Tolerance  Patient tolerated treatment well    Behavior During Therapy  [facility] for tasks assessed/performed       Past Medical History:  Diagnosis Date  . ADHD (attention deficit hyperactivity disorder)     Past Surgical History:  Procedure Laterality Date  . HERNIA REPAIR      There were no vitals filed for this visit.  Subjective Assessment - 03/08/19 1608    Subjective  Patient reports he did not notice a large effect following last treatment session after iontophoresis was applied to the plantar aspect of the foot. He feels like prior treatments applied to the dorsal aspect of the foot were possibly more effective. However, he notes he is able to see the extensor digitorum longus tendon over his L third MPT better than usual today. He reports that often it is swollen there so the tendon is hard to see. He reports he only has pain right now when he moves his foot a lot. Reports no adverse reaction to treatment less treatment.    Pertinent History  Pt is referred is here with left foot pain secondary to third MTP synovitis/metatarsalgia. About a year ago while running, he stepped awkwardly on a rock and had pain immediately at the third MTP joint.  He was able to continue to run another hour and a half.  It hurt for a while but he was able to continue running intermittently.  Then he took about 3 months off and pain was significantly improved. He  started running again the pain came right back as well as swelling. When he gets swelling at the third MTP he gets some mild numbness just distal to the third MTP. No history of gout. It does not bother him with day-to-day activities when wearing supportive well-cushioned footwear. Pt has tried therapeutic ultrasound under physical therapy guidance and he is able to get full range of motion without pain. He has also tried estim without relief. He tried trigger point dry needling with improvement but then stopped due to concerns of some nerve related pain. He has tried nitroglycerin patch therapy but did not use consistently enough to notice improvement. No history of stress fractures.  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 L ankle sprain multiple years ago but no current pain. History of L lateral knee pain 2013 which continues to bother him intermittently. He had a L knee MRI which was unremarkable. He has had multiple MSK ultrasound of his L knee which showed a plantaris tear and popliteus tear. No L hip issues. No history of back pain.    Limitations  Walking    How long can you walk comfortably?  Pt can run for about 6 miles currently    Diagnostic tests  Plain film radiographs and MSK ultrasound  Currently in Pain?  No/denies        TREATMENT:  IONTOPHORESIS Iontophoresis with 2.0 mL of dexamethasone (4mg /mL) applied to plantar aspect of L third metatarsal at dose of 40 mA x min, utilized current of 2.0 mA for 20minutes.Encouraged patient to inspect skin for irritation.Checked skin for irritation, no visual irritation noted. Patient positioned in seated position with L foot elevated during today's session.   ASSESSMENT/CLINICAL IMPRESSION: Patient tolerated treatment well with no adverse skin reaction noted. Instructed patient to leave medicated patch on for 1-2 hours following completion of electrotherapy in order to maximize delivery  of dexamethasone. Patient continues to demonstrate swelling over the dorsal aspect of L foot 3rd MTP and altered soft tissue mobility to palpation over the plantar aspect of the same region. Continued with iontophoresis treatment in line with current POC. Following treatment patient reports his foot feels better and less swollen following today's treatment. He also reports a cooling sensation over the region of iontophoresis application. Patient would benefit from continued physical therapy to address remaining impairments and functional limitations to work towards stated goals and return to PLOF or maximal functional independence   PT Education - 03/08/19 1609    Education Details  plan of care, iontophoresis    Person(s) Educated  Patient    Methods  Explanation;Demonstration    Comprehension  Verbalized understanding       PT Short Term Goals - 02/26/19 1032      PT SHORT TERM GOAL #1   Title  Pt will be independent with HEP in order to decrease 3rd MTP pain in order to improve pain-free distance running    Time  4    Period  Weeks    Status  New    Target Date  03/22/19        PT Long Term Goals - 02/26/19 1033      PT LONG TERM GOAL #1   Title  Pt will increase LEFS by at least 9 points in order to demonstrate significant improvement in lower extremity function.    Baseline  02/22/19: 68/80    Time  8    Period  Weeks    Status  New    Target Date  04/19/19      PT LONG TERM GOAL #2   Title  Pt will decrease worst pain as reported on NPRS by at least 3 points in order to demonstrate clinically significant reduction in ankle/foot pain.    Baseline  02/22/19: 5/10    Time  8    Period  Weeks    Status  New    Target Date  04/19/19            Plan - 03/08/19 1620    Clinical Impression Statement  Patient tolerated treatment well with no adverse skin reaction noted. Instructed patient to leave medicated patch on for 1-2 hours following completion of electrotherapy in  order to maximize delivery of dexamethasone. Patient continues to demonstrate swelling over the dorsal aspect of L foot 3rd MTP and altered soft tissue mobility to palpation over the plantar aspect of the same region. Continued with iontophoresis treatment in line with current POC. Following treatment patient reports his foot feels better and less swollen following today's treatment. He also reports a cooling sensation over the region of iontophoresis application. Patient would benefit from continued physical therapy to address remaining impairments and functional limitations to work towards stated goals and return to PLOF or maximal functional independence.  Personal Factors and Comorbidities  Time since onset of injury/illness/exacerbation;Past/Current Experience    Examination-Activity Limitations  Other   Running   Stability/Clinical Decision Making  Stable/Uncomplicated    Rehab Potential  Good    PT Frequency  2x / week    PT Duration  8 weeks    PT Treatment/Interventions  Aquatic Therapy;Canalith Repostioning;Electrical Stimulation;Cryotherapy;Iontophoresis 4mg /ml Dexamethasone;Moist Heat;Traction;Ultrasound;Parrafin;Fluidtherapy;Contrast Bath;DME Instruction;Gait training;Stair training;Functional mobility training;Therapeutic activities;Therapeutic exercise;Balance training;Neuromuscular re-education;Patient/family education;Manual techniques;Dry needling;Splinting;Taping;Vestibular;Joint Manipulations    PT Next Visit Plan  Iontophoresis with dexamethasone, tarsal tunnel, consider TDN for FDL, FDB, interossei, and quadratus plantae;    PT Home Exercise Plan  None currently    Consulted and Agree with Plan of Care  Patient       Patient will benefit from skilled therapeutic intervention in order to improve the following deficits and impairments:  Pain  Visit Diagnosis: Pain in left foot     Problem List Patient Active Problem List   Diagnosis Date Noted  . Left knee pain  07/21/2018  . Dyspnea on exertion 08/05/2017  . Cyst of tendon sheath 05/11/2017  . GERD 11/21/2009  . Attention deficit hyperactivity disorder (ADHD) 06/10/2009    08/08/2009. Luretha Murphy, PT, DPT 03/08/19, 4:34 PM  [place] [facility] PHYSICAL AND SPORTS MEDICINE [address], [address], [place] Phone: [phone]   Fax:  [phone]  Name: Calvin Ryan MRN: [id number] Lints Date of Birth: 11-28-1980

## 2019-03-12 ENCOUNTER — Ambulatory Visit: Payer: PRIVATE HEALTH INSURANCE | Admitting: Psychology

## 2019-03-13 ENCOUNTER — Other Ambulatory Visit: Payer: Self-pay

## 2019-03-13 ENCOUNTER — Ambulatory Visit: Payer: PRIVATE HEALTH INSURANCE | Admitting: Physical Therapy

## 2019-03-13 ENCOUNTER — Encounter: Payer: Self-pay | Admitting: Physical Therapy

## 2019-03-13 DIAGNOSIS — M79672 Pain in left foot: Secondary | ICD-10-CM | POA: Diagnosis not present

## 2019-03-13 NOTE — Therapy (Signed)
[facility] PHYSICAL AND SPORTS MEDICINE [address] [place], [place], [address] Phone: [phone]   Fax:  [phone]  Physical Therapy Treatment  Patient Details  Name: Calvin Lintsllan C Nason MRN: [id number] Date of Birth: December 02, 1980 Referring Provider (PT): Dr. Prince RomeHilts   Encounter Date: 03/13/2019  PT End of Session - 03/13/19 1609    Visit Number  6    Number of Visits  17    Date for PT Re-Evaluation  04/19/19    PT Start Time  1547    PT Stop Time  1555    PT Time Calculation (min)  8 min    Activity Tolerance  Patient tolerated treatment well    Behavior During Therapy  [employer] for tasks assessed/performed       Past Medical History:  Diagnosis Date  . ADHD (attention deficit hyperactivity disorder)     Past Surgical History:  Procedure Laterality Date  . HERNIA REPAIR      There were no vitals filed for this visit.  Subjective Assessment - 03/13/19 1605    Subjective  Patient reports that he felt okay following last treatment session with no adverse reaction at the skin or otherwise. He feels that his swelling has been more localized since last session but overall continues to have pain with prolonged weight bearing activities. He did some stretching into flexion and extension since last session that seemed to help some with the pain and he did not do any dry needling sicne last session. He went on a 1 hour run this morning and it did okay but it started really bothering him more in the last hour and rates his pain currently as 2/10 over the L third MTP joint. States he prefers iontophoresis applied over the dorsal aspect of the foot.    Pertinent History  Pt is referred is here with left foot pain secondary to third MTP synovitis/metatarsalgia. About a year ago while running, he stepped awkwardly on a rock and had pain immediately at the third MTP joint.  He was able to continue to run another hour and a half.  It hurt for a while but he was  able to continue running intermittently.  Then he took about 3 months off and pain was significantly improved. He started running again the pain came right back as well as swelling. When he gets swelling at the third MTP he gets some mild numbness just distal to the third MTP. No history of gout. It does not bother him with day-to-day activities when wearing supportive well-cushioned footwear. Pt has tried therapeutic ultrasound under physical therapy guidance and he is able to get full range of motion without pain. He has also tried estim without relief. He tried trigger point dry needling with improvement but then stopped due to concerns of some nerve related pain. He has tried nitroglycerin patch therapy but did not use consistently enough to notice improvement. No history of stress fractures.  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 L ankle sprain multiple years ago but no current pain. History of L lateral knee pain 2013 which continues to bother him intermittently. He had a L knee MRI which was unremarkable. He has had multiple MSK ultrasound of his L knee which showed a plantaris tear and popliteus tear. No L hip issues. No history of back pain.    Limitations  Walking    How long can you walk comfortably?  Pt  can run for about 6 miles currently    Diagnostic tests  Plain film radiographs and MSK ultrasound    Currently in Pain?  Yes    Pain Score  2     Pain Location  Toe (Comment which one)   L 3rd MTP joint   Pain Orientation  Left       TREATMENT:  IONTOPHORESIS Iontophoresis with2.0 mL of dexamethasone (4mg /mL) applied toplantaraspect of L third metatarsal at dose of 40 mA x min, utilized current of2.5 mA for14minutes.Encouraged patient to inspect skin for irritation.Checked skin for irritation, no visual irritation noted. Patient positioned in seated position with L foot elevated during today's session.  ASSESSMENT/CLINICAL  IMPRESSION: Patient tolerated treatment well and had no evidence of adverse skin reaction to previous treatments. Patient continues to demonstrate swelling over the dorsal aspect of L foot 3rd MTP with decreased visualization of extensor digitorum longus tendon over the joint. Continued with iontophoresis treatment in line with current POC. Following treatment patient reports his foot feels better and less swollen following today's treatment. Patient would benefit from continued physical therapy to address remaining impairments and functional limitations to work towards stated goals and return to PLOF or maximal functional independence    PT Education - 03/13/19 1608    Education Details  plan of care, iontophoresis, side effects of steroid medication contrasting oral, injection, and iontophoreis delivery    Person(s) Educated  Patient    Methods  Explanation    Comprehension  Verbalized understanding       PT Short Term Goals - 03/13/19 1619      PT SHORT TERM GOAL #1   Title  Pt will be independent with HEP in order to decrease 3rd MTP pain in order to improve pain-free distance running    Time  4    Period  Weeks    Status  Achieved    Target Date  03/22/19        PT Long Term Goals - 02/26/19 1033      PT LONG TERM GOAL #1   Title  Pt will increase LEFS by at least 9 points in order to demonstrate significant improvement in lower extremity function.    Baseline  02/22/19: 68/80    Time  8    Period  Weeks    Status  New    Target Date  04/19/19      PT LONG TERM GOAL #2   Title  Pt will decrease worst pain as reported on NPRS by at least 3 points in order to demonstrate clinically significant reduction in ankle/foot pain.    Baseline  02/22/19: 5/10    Time  8    Period  Weeks    Status  New    Target Date  04/19/19            Plan - 03/13/19 1613    Clinical Impression Statement  Patient tolerated treatment well and had no evidence of adverse skin reaction to  previous treatments. Patient continues to demonstrate swelling over the dorsal aspect of L foot 3rd MTP with decreased visualization of extensor digitorum longus tendon over the joint. Continued with iontophoresis treatment in line with current POC. Following treatment patient reports his foot feels better and less swollen following today's treatment. Patient would benefit from continued physical therapy to address remaining impairments and functional limitations to work towards stated goals and return to PLOF or maximal functional independence    Personal Factors and Comorbidities  Time since onset of injury/illness/exacerbation;Past/Current Experience    Examination-Activity Limitations  Other   Running   Stability/Clinical Decision Making  Stable/Uncomplicated    Rehab Potential  Good    PT Frequency  2x / week    PT Duration  8 weeks    PT Treatment/Interventions  Aquatic Therapy;Canalith Repostioning;Electrical Stimulation;Cryotherapy;Iontophoresis 4mg /ml Dexamethasone;Moist Heat;Traction;Ultrasound;Parrafin;Fluidtherapy;Contrast Bath;DME Instruction;Gait training;Stair training;Functional mobility training;Therapeutic activities;Therapeutic exercise;Balance training;Neuromuscular re-education;Patient/family education;Manual techniques;Dry needling;Splinting;Taping;Vestibular;Joint Manipulations    PT Next Visit Plan  Iontophoresis with dexamethasone, tarsal tunnel, consider TDN for FDL, FDB, interossei, and quadratus plantae;    PT Home Exercise Plan  single leg stance, doming    Consulted and Agree with Plan of Care  Patient       Patient will benefit from skilled therapeutic intervention in order to improve the following deficits and impairments:  Pain  Visit Diagnosis: Pain in left foot     Problem List Patient Active Problem List   Diagnosis Date Noted  . Left knee pain 07/21/2018  . Dyspnea on exertion 08/05/2017  . Cyst of tendon sheath 05/11/2017  . GERD 11/21/2009  .  Attention deficit hyperactivity disorder (ADHD) 06/10/2009    08/08/2009. Luretha Murphy, PT, DPT 03/13/19, 4:19 PM  [place] [facility] PHYSICAL AND SPORTS MEDICINE [address], [place] Phone: [phone]   Fax:  [phone]  Name: DUJUAN STANKOWSKI MRN: Calvin Ryan Date of Birth: 08/16/1980

## 2019-03-15 ENCOUNTER — Other Ambulatory Visit: Payer: Self-pay

## 2019-03-15 ENCOUNTER — Ambulatory Visit: Payer: PRIVATE HEALTH INSURANCE

## 2019-03-15 DIAGNOSIS — M79672 Pain in left foot: Secondary | ICD-10-CM

## 2019-03-15 NOTE — Therapy (Signed)
[facility] PH[facility]TS MEDICINE [address], [place], [address] Phone: [phone]   Fax:  [phone]  Physical Therapy Treatment  Patient Details  Name: Calvin Ryan MRN: [id number] Date of Birth: March 24, 1981 Referring Provider (PT): Dr. Prince Rome   Encounter Date: 03/15/2019  PT End of Session - 03/15/19 1610    Visit Number  7    Number of Visits  17    Date for PT Re-Evaluation  04/19/19    PT Start Time  1611    PT Stop Time  1637    PT Time Calculation (min)  26 min    Activity Tolerance  Patient tolerated treatment well    Behavior During Therapy  [facility] for tasks assessed/performed       Past Medical History:  Diagnosis Date  . ADHD (attention deficit hyperactivity disorder)     Past Surgical History:  Procedure Laterality Date  . HERNIA REPAIR      There were no vitals filed for this visit.  Subjective Assessment - 03/15/19 1623    Subjective  L foot felt really good 2 days ago. Pt was also trying to stretch it which might have aggravated it (extension stretches). Feels pretty good today. Yesterday felt aggravated. No pain currently. Thinks his foot is a little less swelling the day after iontophoresis. Global swelling is less. Does not seem to notice anything difference with comfort level when running after iontophoresis.    Pertinent History  Pt is referred is here with left foot pain secondary to third MTP synovitis/metatarsalgia. About a year ago while running, he stepped awkwardly on a rock and had pain immediately at the third MTP joint.  He was able to continue to run another hour and a half.  It hurt for a while but he was able to continue running intermittently.  Then he took about 3 months off and pain was significantly improved. He started running again the pain came right back as well as swelling. When he gets swelling at the third MTP he gets some mild numbness just distal to the third MTP. No history of  gout. It does not bother him with day-to-day activities when wearing supportive well-cushioned footwear. Pt has tried therapeutic ultrasound under physical therapy guidance and he is able to get full range of motion without pain. He has also tried estim without relief. He tried trigger point dry needling with improvement but then stopped due to concerns of some nerve related pain. He has tried nitroglycerin patch therapy but did not use consistently enough to notice improvement. No history of stress fractures.  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 L ankle sprain multiple years ago but no current pain. History of L lateral knee pain 2013 which continues to bother him intermittently. He had a L knee MRI which was unremarkable. He has had multiple MSK ultrasound of his L knee which showed a plantaris tear and popliteus tear. No L hip issues. No history of back pain.    Limitations  Walking    How long can you walk comfortably?  Pt can run for about 6 miles currently    Diagnostic tests  Plain film radiographs and MSK ultrasound    Currently in Pain?  No/denies                               PT Education -  03/15/19 1626    Education Details  iontophoresis    Person(s) Educated  Patient    Methods  Explanation;Demonstration;Tactile cues;Verbal cues    Comprehension  Verbalized understanding;Returned demonstration      TREATMENT:  IONTOPHORESIS Iontophoresis with2.0 mL of dexamethasone (4mg /mL) applied toplantaraspect of L third metatarsal at dose of 40 mA x min, utilized current of2.5 mA for81minutes.Encouraged patient to inspect skin for irritation.Checked skin for irritation, no visual irritation noted. Patient positioned in seated position with L foot elevatedduring today's session.  ASSESSMENT/CLINICAL IMPRESSION:   Decreasing L dorsal foot swelling after iontophoresis treatment based on subjective  reports. Continued current plan of care utilizing the modality to continue progress. Pt tolerated treatment without adverse effects. Pt will benefit from continued skilled physical therapy services to decrease swelling, and improved comfort level when performing aerobic activities such as running.      PT Short Term Goals - 03/13/19 1619      PT SHORT TERM GOAL #1   Title  Pt will be independent with HEP in order to decrease 3rd MTP pain in order to improve pain-free distance running    Time  4    Period  Weeks    Status  Achieved    Target Date  03/22/19        PT Long Term Goals - 02/26/19 1033      PT LONG TERM GOAL #1   Title  Pt will increase LEFS by at least 9 points in order to demonstrate significant improvement in lower extremity function.    Baseline  02/22/19: 68/80    Time  8    Period  Weeks    Status  New    Target Date  04/19/19      PT LONG TERM GOAL #2   Title  Pt will decrease worst pain as reported on NPRS by at least 3 points in order to demonstrate clinically significant reduction in ankle/foot pain.    Baseline  02/22/19: 5/10    Time  8    Period  Weeks    Status  New    Target Date  04/19/19            Plan - 03/15/19 1627    Clinical Impression Statement  Decreasing L dorsal foot swelling after iontophoresis treatment based on subjective reports. Continued current plan of care utilizing the modality to continue progress. Pt tolerated treatment without adverse effects. Pt will benefit from continued skilled physical therapy services to decrease swelling, and improved comfort level when performing aerobic activities such as running.    Personal Factors and Comorbidities  Time since onset of injury/illness/exacerbation;Past/Current Experience    Examination-Activity Limitations  Other   Running   Stability/Clinical Decision Making  Stable/Uncomplicated    Rehab Potential  Good    PT Frequency  2x / week    PT Duration  8 weeks    PT  Treatment/Interventions  Aquatic Therapy;Canalith Repostioning;Electrical Stimulation;Cryotherapy;Iontophoresis 4mg /ml Dexamethasone;Moist Heat;Traction;Ultrasound;Parrafin;Fluidtherapy;Contrast Bath;DME Instruction;Gait training;Stair training;Functional mobility training;Therapeutic activities;Therapeutic exercise;Balance training;Neuromuscular re-education;Patient/family education;Manual techniques;Dry needling;Splinting;Taping;Vestibular;Joint Manipulations    PT Next Visit Plan  Iontophoresis with dexamethasone, tarsal tunnel, consider TDN for FDL, FDB, interossei, and quadratus plantae;    PT Home Exercise Plan  single leg stance, doming    Consulted and Agree with Plan of Care  Patient       Patient will benefit from skilled therapeutic intervention in order to improve the following deficits and impairments:  Pain  Visit Diagnosis: Pain in left foot  Problem List Patient Active Problem List   Diagnosis Date Noted  . Left knee pain 07/21/2018  . Dyspnea on exertion 08/05/2017  . Cyst of tendon sheath 05/11/2017  . GERD 11/21/2009  . Attention deficit hyperactivity disorder (ADHD) 06/10/2009    Loralyn FreshwaterMiguel Aldyn Toon PT, DPT   03/15/2019, 4:45 PM  [place] [facility] [address] [place], [place], [address] Phone: [phone]   Fax:  [phone]  Name: Rosamaria Lintsllan C Dec MRN: [id number] Date of Birth: September 25, 1980

## 2019-03-20 ENCOUNTER — Ambulatory Visit (INDEPENDENT_AMBULATORY_CARE_PROVIDER_SITE_OTHER): Payer: PRIVATE HEALTH INSURANCE | Admitting: Psychology

## 2019-03-20 DIAGNOSIS — F331 Major depressive disorder, recurrent, moderate: Secondary | ICD-10-CM

## 2019-04-23 ENCOUNTER — Ambulatory Visit (INDEPENDENT_AMBULATORY_CARE_PROVIDER_SITE_OTHER): Payer: PRIVATE HEALTH INSURANCE | Admitting: Psychology

## 2019-04-23 DIAGNOSIS — F331 Major depressive disorder, recurrent, moderate: Secondary | ICD-10-CM

## 2019-05-16 ENCOUNTER — Ambulatory Visit: Payer: PRIVATE HEALTH INSURANCE | Attending: Family Medicine

## 2019-05-16 ENCOUNTER — Other Ambulatory Visit: Payer: Self-pay

## 2019-05-16 DIAGNOSIS — M25512 Pain in left shoulder: Secondary | ICD-10-CM | POA: Insufficient documentation

## 2019-05-16 DIAGNOSIS — M25522 Pain in left elbow: Secondary | ICD-10-CM | POA: Insufficient documentation

## 2019-05-16 DIAGNOSIS — G8929 Other chronic pain: Secondary | ICD-10-CM | POA: Insufficient documentation

## 2019-05-16 NOTE — Therapy (Signed)
[facility] MAIN [facility] SERVICES [address] [place], [place], [address] Phone: [phone]   Fax:  [phone]  Physical Therapy Evaluation  Patient Details  Name: Calvin Ryan MRN: [id number] Date of Birth: [birth year] Referring Provider (PT): Dr Donette Larry   Encounter Date: 05/16/2019  PT End of Session - 05/18/19 1147    Visit Number  1    Number of Visits  17    Date for PT Re-Evaluation  07/11/19    PT Start Time  1650    PT Stop Time  1745    PT Time Calculation (min)  55 min    Activity Tolerance  Patient tolerated treatment well    Behavior During Therapy  [facility] for tasks assessed/performed       Past Medical History:  Diagnosis Date  . ADHD (attention deficit hyperactivity disorder)     Past Surgical History:  Procedure Laterality Date  . HERNIA REPAIR      There were no vitals filed for this visit.   Subjective Assessment - 05/18/19 1142    Subjective  L shoulder and L elbow pain    Pertinent History  Pt complains of bilateral shoulder soreness over the course of multiple years. He was hiking approximately 3 years ago and fell backwards supporting himself on his L shoulder in an extended position. He saw a PT and introduced formal exercises with resolution over the period of a few weeks. Prior to COVID he was working out regularly and was improving his shoulder and upper quarter strength. He started having increased shoulder pain since he stopped working out and now reports pain-avoidant behavior with resistance training secondary to the pain. He was rock climbing with his kids in August 2020 and went to point to a location and felt a sharp pain deep in his L shoulder. He reports that this pain is deeper than what he has experienced in the past. Pain is in the anterior shoulder. In addition in September he was building a climbing apparatus and torqung in a lot of bolts and started to have significant anterolateral L elbow pain. "It  feels like it is right over my radial head." Pt reports that his L shoulder pain is improving but is still very limiting. 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 He has tried the Voltaren gel on the shoulder without significant improvement. "I wouldn't be surprised if I injured part of the labrum." Pt states that the elbow seems like typical epicondylagia. No previous or current neck pain.    Diagnostic tests  None for L shoulder and L elbow    Patient Stated Goals  Decrease L elbow pain with work related responsibilities, resolve L shoulder pain in order to be able to rock climb    Currently in Pain?  No/denies    Pain Score  --   0/10 Present, 0/10 Best, 4/10 Worst   Pain Location  Shoulder    Pain Orientation  Left    Pain Descriptors / Indicators  Sore    Pain Type  Chronic pain    Pain Radiating Towards  None    Pain Onset  More than a month ago    Pain Frequency  Intermittent    Aggravating Factors   shoulder: resistance training, hang from hanging board, bench press,  abduction, flexion exercises    Pain Relieving Factors  shoulder: ER strengthening, altering sleeping positions;    Multiple Pain Sites  Yes    Pain Score  1   Present: 1/10, Best: 0/10, Worst: 7/10   Pain Location  Elbow    Pain Orientation  Left    Pain Descriptors / Indicators  Aching;Sore    Pain Type  Chronic pain    Pain Radiating Towards  None    Pain Onset  More than a month ago    Pain Frequency  Constant    Aggravating Factors   picking up weighted ball while gripping, full L elbow extension and especially gripping while elbow is fully extended    Pain Relieving Factors  Voltaren, activity avoidance    Effect of Pain on Daily Activities  Limits work         [facility] PT Assessment - 05/18/19 1145       Assessment   Medical Diagnosis  L shoulder pain    Referring Provider (PT)  Dr Donette Larry    Onset Date/Surgical Date  01/06/19    Hand Dominance  Right    Next MD Visit  None scheduled    Prior Therapy  2-3 years ago for L shoulder      Precautions   Precautions  None      Restrictions   Weight Bearing Restrictions  No      Balance Screen   Has the patient fallen in the past 6 months  No    Has the patient had a decrease in activity level because of a fear of falling?   No    Is the patient reluctant to leave their home because of a fear of falling?   No      Home Public house manager residence    Living Arrangements  Spouse/significant other    Available Help at Discharge  Family    Type of Home  House      Prior Function   Level of Independence  Independent    Vocation  Full time employment    Vocation Requirements  Full time PT    Leisure  Ultra-marathons and long-distance running      Cognition   Overall Cognitive Status  Within Functional Limits for tasks assessed         SUBJECTIVE Chief complaint: Pt complains of bilateral shoulder soreness over the course of multiple years. He was hiking approximately 3 years ago and fell backwards supporting himself on his L shoulder in an extended position. He saw a PT and introduced formal exercises with resolution over the period of a few weeks. Prior to COVID he was working out regularly and was improving his shoulder and upper quarter strength. He started having increased shoulder pain since he stopped working out and now reports pain-avoidant behavior with resistance training secondary to the pain. He was rock climbing with his kids in August 2020 and went to point to a location and felt a sharp pain deep in his L shoulder. He reports that this pain is deeper than what he has experienced in the past. Pain is in the anterior shoulder. In addition in September he was building a climbing apparatus and torqung in a  lot of bolts and started to have significant anterolateral L elbow pain. "It feels like it is right over my radial head." Pt reports that his L shoulder pain is improving but is still very limiting. 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 He has used Voltaren gel over L elbow with significant improvement in the constant pain however pain still persists with aggravating activities. He has tried the Voltaren gel on the shoulder without significant improvement. "I wouldn't be surprised if I injured part of the labrum." Pt states that the elbow seems like typical epicondylagia. No previous or current neck pain.  Shoulder trauma: Yes, approximately 3 years ago Pain: Shoulder: 0/10 Present, 0/10 Best, 4/10 Worst; Elbow: Present: 1/10, Best: 0/10, Worst: 7/10; Aggravating factors: shoulder: resistance training, hang from hanging board, bench press, abduction, flexion exercises: elbow: picking up weighted ball while gripping, full L elbow extension and especially gripping while elbow is fully extended Easing factors: shoulder: ER strengthening, altering sleeping positions; elbow: Voltaren, activity avoidance 24 hour pain behavior: No change, doesn't wake up at night  Radiating pain: No Numbness/Tingling: No Follow-up appointment with MD: No Dominant hand: Right Imaging: None  OBJECTIVE  MUSCULOSKELETAL: Tremor: Normal Bulk: Normal Tone: Normal  Cervical Screen AROM: WFL and painless with overpressure in all planes Spurlings A (ipsilateral lateral flexion/axial compression): R: Negative L: Negative Spurlings B (ipsilateral lateral flexion/contralateral rotation/axial compression): R: Negative L: Negative Hoffman Sign (cervical cord compression): R: Negative L: Negative ULTT Median: R: Negative L: Negative ULTT Ulnar: R: Negative L: Negative ULTT Radial: R: Negative L: Negative  Palpation No pain with palpation to anterior, posterior,  lateral, and superior shoulder. No pain with palpation over the long head of the biceps. Pain with deep palpation to anterior and and anterolateral L elbow over the radial head.   Strength R/L 5/5* Shoulder flexion (anterior deltoid/pec major/coracobrachialis, axillary n. (C5-6) and musculocutaneous n. (C5-7)) 5/5* Shoulder abduction (deltoid/supraspinatus, axillary/suprascapular n, C5) 5/5* Shoulder external rotation (infraspinatus/teres minor) 5/5 Shoulder internal rotation (subcapularis/lats/pec major) NT/5 Shoulder extension (posterior deltoid, lats, teres major, axillary/thoracodorsal n.) 4/4 Mid trap 4/4+ Low trap 5/5 Elbow flexion (biceps brachii, brachialis, brachioradialis, musculoskeletal n, C5-6) 5/5 Elbow extension (triceps, radial n, C7) 5/5* Wrist Extension 5/5 Wrist Flexion L wrist radial and ulnar deviation are both 5/5  and painless Pain-free L forearm pronation, mild increase in pain reported with resisted supination *Indicates pain  Painfree grip strength (average of 2): R: 138#  L: 93#   Pain with resisted 3rd MTP extension  ROM R/L HBB and HBH symmetrical, L shoulder HBH is painful, grossly 6-8 inches between hand positions from internal to external rotation bilaterally AROM WNL and symmetrical shoulder flexion and abduction however pain reported at end range L shoulder flexion and abduction both actively and passively External rotation grossly estimated R/L 100/90  Internal rotation grossly estimated 70/70 L shoulder extension painless and appears grossly WNL Slight deficit noted in L elbow supination and "tightness" reported at end range Wrist AROM/PROM flexion appears grossly full and symmetrical however painful flexion on the L at lateral mass of elbow. Worsened when elbow is straight   Accessory Motions/Glides Glenohumeral: Posterior: R: not examined L: normal Inferior: R: not examined L: normal Anterior: R: not examined L: not  examined  Acromioclavicular:  Posterior: R: not examined L: normal Anterior: R: not examined L: normal  Sternoclavicular: Posterior: R: not examined L: normal Anterior: R: not examined L: not examined Superior: R: not examined L: not examined Inferior: R: not examined L: normal  Scapulothoracic: Distraction: R: not examined L: not examined Medial: R: not examined L: not examined Lateral: R: not examined L: not examined Inferior: R: not examined L: not  examined Superior: R: not examined L: not examined   Radial head mobilitiy: anterior to posterior painful, posterior to anterior painless. Mobility for both appears grossly WNL   NEUROLOGICAL:  Mental Status Patient is oriented to person, place and time.  Recent memory is intact.  Remote memory is intact.  Attention span and concentration are intact.  Expressive speech is intact.  Patient's fund of knowledge is within normal limits for educational level.  Sensation Grossly intact to light touch bilateral UE as determined by testing dermatomes C2-T2 Proprioception and hot/cold testing deferred on this date  Reflexes Deferred   SPECIAL TESTS  Rotator Cuff  Drop Arm Test: Negative Painful Arc (Pain from 60 to 120 degrees scaption): Negative Infraspinatus Muscle Test: Positive If all 3 tests positive, the probability of a full-thickness rotator cuff tear is 91%  Subacromial Impingement Hawkins-Kennedy: Negative Neer (Block scapula, PROM flexion): Positive Painful Arc (Pain from 60 to 120 degrees scaption): Negative Empty Can: Positive External Rotation Resistance: Positive Horizontal Adduction: Positive Scapular Assist: Possible reduction Positive Hawkins-Kennedy, Painful arc sign, Infraspinatus muscle test then +LR: 10.56 of some type of impingement present, 2/3 tests: +LR 5.06, -LR 0.17, Positive 3/5 Hawkins-Kennedy, neer, painful arc, empty can, and external rotation resistance then SN: .75 (.54-.96) SP: .74  (.61-.88) +LR: 2.93 (1.60-5.36) -LR: .34 (.14-.80)  Labral Tear Biceps Load II (120 elevation, full ER, 90 elbow flexion, full supination, resisted elbow flexion): Negative Crank (160 scaption, axial load with IR/ER): Negative Active Compression Test: Positive  Bicep Tendon Pathology Speed (shoulder flexion to 90, external rotation, full elbow extension, and forearm supination with resistance: Negative Yergason's (resisted shoulder ER and supination/biceps tendon pathology): Negative  Shoulder Instability Sulcus Sign: Negative Anterior Apprehension: Negative      Objective measurements completed on examination: See above findings.     TREATMENT  Trigger Point Dry Needling (TDN), unbilled Education performed with patient regarding potential benefit of TDN. Reviewed precautions and risks with patient. Pt provided verbal consent to treatment. TDN performed to L ECRB with 1, 0.30 x 30 single needle placement as well as threading technique to entire L lateral mass with 1, 0.30 x 50 single needle placement.  Local twitch response (LTR) with both placements. Pistoning technique utilized.  Manual Therapy  MWM to L elbow with lateral glide during gripping x 10; STM to L lateral mass with trigger point release while moving elbow through pronation/supination ROM; Mills manipulation to L radial head with cavitation;              PT Short Term Goals - 05/17/19 2027      PT SHORT TERM GOAL #1   Title  Pt will be independent with HEP in order to manage/decrease L shoulder and L elbow pain so he can resume full activity at home/work    Time  4    Period  Weeks    Status  New    Target Date  06/13/19        PT Long Term Goals - 05/17/19 2028      PT LONG TERM GOAL #1   Title  Pt will decrease quick DASH score by at least 8% in order to demonstrate clinically significant reduction in disability.    Baseline  05/16/19: 25%    Time  8    Period  Weeks    Status  New     Target Date  07/11/19      PT LONG TERM GOAL #2   Title  Pt will  decrease worst pain as reported on NPRS by at least 3 points in order to demonstrate clinically significant reduction in L shoulder and L elbow pain    Baseline  05/16/19: shoulder: 4/10, elbow: 7/10    Time  8    Period  Weeks    Status  New    Target Date  07/11/19      PT LONG TERM GOAL #3   Title  Pt will improve pain-free L grip strength to within 5% of R grip strength in order to complete all work related activities without increase in pain while gripping    Baseline  05/16/19: R: 138#, L: 93#    Time  8    Period  Weeks    Status  New    Target Date  07/11/19      PT LONG TERM GOAL #4   Title  Pt will demonstrate improvement in his PRTEE by at least 11 points in order to demonstrate significant improvement in L elbow pain so he can resume pain-free function at home and work.    Baseline  05/16/19: 25/100    Time  8    Period  Weeks    Status  New    Target Date  07/11/19             Plan - 05/18/19 1149    Clinical Impression Statement  Pt is a pleasant 38 year-old male referred for L shoulder pain. PT examination reveals L shoulder which is strong in all planes but painful with resisted flexion, abduction, and ER. R shoulder appears grossly symmetrical to L shoulder with pain reported during both active and passive flexion and abduction. Positive infraspinatus muscle testing, Neer, empty can, and horizontal adduction testing. History and exam are most consistent with subacromial impingement syndrome with very mild concern for possible labral involvement with positive active compression test and mechanism of initial injury. Pt also complaining of L lateral elbow pain during initial evaluation. Pt is tender to deep palpation over radial head. Left wrist AROM/PROM flexion appears grossly full however painful at the lateral mass. Pain is worse when elbow is straight. Pain reported with gripping and with resisted  third MTP extension. Anterior to posterior passive mobility testing of radial head is pain. Pt also presents with slight deficit in L elbow supination and "tightness" reported at end range. 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 Pt will benefit from PT services to address deficits in L shoulder and elbow pain in order to return to full activity at home, work, and with leisure activities such as rock climbing.    Personal Factors and Comorbidities  Time since onset of injury/illness/exacerbation;Profession    Examination-Activity Limitations  Other;Lift;Reach Overhead;Caring for Others    Examination-Participation Restrictions  Other   Work and leisure activities   Stability/Clinical Decision Making  Evolving/Moderate complexity    Clinical Decision Making  Low    Rehab Potential  Good    PT Frequency  2x / week    PT Duration  8 weeks    PT Treatment/Interventions  Aquatic Therapy;Canalith Repostioning;Electrical Stimulation;Cryotherapy;Iontophoresis /ml Dexamethasone;Moist Heat;Traction;Ultrasound;Parrafin;Fluidtherapy;Contrast Bath;DME Instruction;Gait training;Stair training;Functional mobility training;Therapeutic activities;Therapeutic exercise;Balance training;Neuromuscular re-education;Patient/family education;Manual techniques;Dry needling;Splinting;Taping;Vestibular;Joint Manipulations;ADLs/Self Care Home Management;Passive range of motion;Spinal Manipulations    PT Next Visit Plan  Assess scapular mobility, MWM for L elbow, Mills manipulation, STM, TDN    PT Home Exercise Plan  Pt advised to initiate isometric strengthening for L shoulder and elbow, will follow-up with additional  medbridge exercsies    Consulted and Agree with Plan of Care  Patient       Patient will benefit from skilled therapeutic intervention in order to improve the following deficits and impairments:  Pain, Decreased strength  Visit Diagnosis: Chronic left shoulder pain - Plan: PT plan of care  cert/re-cert  Pain in left elbow - Plan: PT plan of care cert/re-cert     Problem List Patient Active Problem List   Diagnosis Date Noted  . Left knee pain 07/21/2018  . Dyspnea on exertion 08/05/2017  . Cyst of tendon sheath 05/11/2017  . GERD 11/21/2009  . Attention deficit hyperactivity disorder (ADHD) 06/10/2009   Lynnea Maizes PT, DPT, GCS  Ala Kratz 05/18/2019, 12:15 PM  [facility] MAIN [facility] SERVICES [address] [place], [place], [address] Phone: [phone]   Fax:  [phone]  Name: KOA ZOELLER MRN: [id number] Date of Birth: 12-21-1980

## 2019-05-24 ENCOUNTER — Ambulatory Visit: Payer: PRIVATE HEALTH INSURANCE

## 2019-05-24 ENCOUNTER — Other Ambulatory Visit: Payer: Self-pay

## 2019-05-24 DIAGNOSIS — M25512 Pain in left shoulder: Secondary | ICD-10-CM

## 2019-05-24 DIAGNOSIS — M25522 Pain in left elbow: Secondary | ICD-10-CM

## 2019-05-24 DIAGNOSIS — G8929 Other chronic pain: Secondary | ICD-10-CM

## 2019-05-24 NOTE — Therapy (Signed)
[facility] [address] [place], [place], [address] Phone: [phone]   Fax:  [phone]  Physical Therapy Treatment  Patient Details  Name: Calvin Lintsllan C Vanmetre MRN: [id number] Date of Birth: 1980-06-22 Referring Provider (PT): Dr Donette LarryHusain   Encounter Date: 05/24/2019  PT End of Session - 05/24/19 0838    Visit Number  2    Number of Visits  17    Date for PT Re-Evaluation  07/11/19    PT Start Time  0840    PT Stop Time  0930    PT Time Calculation (min)  50 min    Activity Tolerance  Patient tolerated treatment well    Behavior During Therapy  [facility] for tasks assessed/performed       Past Medical History:  Diagnosis Date  . ADHD (attention deficit hyperactivity disorder)     Past Surgical History:  Procedure Laterality Date  . HERNIA REPAIR      There were no vitals filed for this visit.  Subjective Assessment - 05/24/19 0836    Subjective  Pt reports multiple good days after initial evaluation and over the course of the last week. His L elbow and shoulder having been more painful over the last couple days. He has initiated isometrics for his L elbow and shoulder with some improvement. 2/10 L elbow pain upon arrival currently.    Pertinent History  Pt complains of bilateral shoulder soreness over the course of multiple years. He was hiking approximately 3 years ago and fell backwards supporting himself on his L shoulder in an extended position. He saw a PT and introduced formal exercises with resolution over the period of a few weeks. Prior to COVID he was working out regularly and was improving his shoulder and upper quarter strength. He started having increased shoulder pain since he stopped working out and now reports pain-avoidant behavior with resistance training secondary to the pain. He was rock climbing with his kids in August 2020 and went to point to a location and felt a sharp pain deep in his L shoulder. He  reports that this pain is deeper than what he has experienced in the past. Pain is in the anterior shoulder. In addition in September he was building a climbing apparatus and torqung in a lot of bolts and started to have significant anterolateral L elbow pain. "It feels like it is right over my radial head." Pt reports that his L shoulder pain is improving but is still very limiting. 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 He has used Voltaren gel over L elbow with significant improvement in the constant pain however pain still persists with aggravating activities. He has tried the Voltaren gel on the shoulder without significant improvement. "I wouldn't be surprised if I injured part of the labrum." Pt states that the elbow seems like typical epicondylagia. No previous or current neck pain.    Diagnostic tests  None for L shoulder and L elbow    Patient Stated Goals  Decrease L elbow pain with work related responsibilities, resolve L shoulder pain in order to be able to rock climb    Currently in Pain?  Yes    Pain Score  2     Pain Location  Elbow    Pain Orientation  Left    Pain Descriptors / Indicators  Sore    Pain Type  Chronic pain    Pain Onset  More than a month ago          TREATMENT   Trigger Point Dry Needling (TDN), unbilled Education performed with patient regarding potential benefit of TDN. Reviewed precautions and risks with patient. Pt provided verbal consent to treatment. TDN performed to L ECRB with 1, 0.30 x 30 single needle placement as well as threading technique to entire L lateral mass with 3, 0.30 x 50 single needle placement.  Local twitch response (LTR) with initial placement and then 2/3 of the threading placements. Pistoning technique utilized.   Manual Therapy  MWM with belt assist to L elbow with lateral glide with forearm in pronation during gripping 2 x 10, 2 x 15, pt reports pain-free gripping but  muscle fatigue; L radial posterior mobilizations grade III, 20s/bout x 4 bouts; L radial anterior mobilizations grade III, 20s/bout x 5 bouts; L ulnar distraction mobilizations with elbow flexed to 90 degrees, grade III, 20s/bout x 2 bouts; L radial head distraction mobilizations with stabilization proximal to elbow, grade III, 20s/bout x 3 bouts; IASTM to L lateral mass extending distal down posterior forearm with trigger point release while moving elbow through pronation/supination and flexion/extension ROM. Also utilized wrist flexion extension; Mills manipulation to L radial head with one cavitation; Reviewed home isometric strengthening exercises for L shoulder and L forearm, issued and demonstrated L shoulder inferior self mobilization in sitting with depression and self inferior towel mobs;   Electrical Stimulation Electrical dry needling performed to L ECRB on 1 channel with biphasic square wave with a negative spike; frequency of 10 Hz and intensity of 4 mA (pt tolerable limit with visible motor response) x 8 minutes;   Pt reports decrease in pain following session. Utilized IASTM and joint mobs during session today as well as electrical trigger point dry needling. Overall he is reporting mild but notable decrease in L lateral elbow pain as well as slight improvement in L shoulder with initiation of isometric strengthening. Pt encouraged to continue current isometric strengthening and follow-up as scheduled. Pt will benefit from PT services to address deficits in L elbow and L shoulder pain in order to return to full function at home and work without pain.                             PT Short Term Goals - 05/17/19 2027      PT SHORT TERM GOAL #1   Title  Pt will be independent with HEP in order to manage/decrease L shoulder and L elbow pain so he can resume full activity at home/work    Time  4    Period  Weeks    Status  New    Target Date  06/13/19         PT Long Term Goals - 05/17/19 2028      PT LONG TERM GOAL #1   Title  Pt will decrease quick DASH score by at least 8% in order to demonstrate clinically significant reduction in disability.    Baseline  05/16/19: 25%    Time  8    Period  Weeks    Status  New    Target Date  07/11/19      PT LONG TERM GOAL #2   Title  Pt will decrease worst pain as reported on NPRS by at least 3 points in order to demonstrate clinically significant reduction in L shoulder and L elbow pain    Baseline  05/16/19: shoulder: 4/10, elbow: 7/10    Time  8    Period  Weeks    Status  New    Target Date  07/11/19      PT LONG TERM GOAL #3   Title  Pt will improve pain-free L grip strength to within 5% of R grip strength in order to complete all work related activities without increase in pain while gripping    Baseline  05/16/19: R: 138#, L: 93#    Time  8    Period  Weeks    Status  New    Target Date  07/11/19      PT LONG TERM GOAL #4   Title  Pt will demonstrate improvement in his PRTEE by at least 11 points in order to demonstrate significant improvement in L elbow pain so he can resume pain-free function at home and work.    Baseline  05/16/19: 25/100    Time  8    Period  Weeks    Status  New    Target Date  07/11/19            Plan - 05/24/19 0839    Clinical Impression Statement  Pt reports decrease in pain following session. Utilized IASTM and joint mobs during session today as well as electrical trigger point dry needling. Overall he is reporting mild but notable decrease in L lateral elbow pain as well as slight improvement in L shoulder with initiation of isometric strengthening. Pt encouraged to continue current isometric strengthening and follow-up as scheduled. Pt will benefit from PT services to address deficits in L elbow and L shoulder pain in order to return to full function at home and work without pain.    Personal Factors and Comorbidities  Time since onset of  injury/illness/exacerbation;Profession    Examination-Activity Limitations  Other;Lift;Reach Overhead;Caring for Others    Examination-Participation Restrictions  Other   Work and leisure activities   Stability/Clinical Decision Making  Evolving/Moderate complexity    Rehab Potential  Good    PT Frequency  2x / week    PT Duration  8 weeks    PT Treatment/Interventions  Aquatic Therapy;Canalith Repostioning;Electrical Stimulation;Cryotherapy;Iontophoresis 4mg /ml Dexamethasone;Moist Heat;Traction;Ultrasound;Parrafin;Fluidtherapy;Contrast Bath;DME Instruction;Gait training;Stair training;Functional mobility training;Therapeutic activities;Therapeutic exercise;Balance training;Neuromuscular re-education;Patient/family education;Manual techniques;Dry needling;Splinting;Taping;Vestibular;Joint Manipulations;ADLs/Self Care Home Management;Passive range of motion;Spinal Manipulations    PT Next Visit Plan  Assess scapular mobility, MWM for L elbow, Mills manipulation, STM, TDN    PT Home Exercise Plan  Pt advised to initiate isometric strengthening for L shoulder and elbow, will follow-up with additional medbridge exercsies    Consulted and Agree with Plan of Care  Patient       Patient will benefit from skilled therapeutic intervention in order to improve the following deficits and impairments:  Pain, Decreased strength  Visit Diagnosis: Chronic left shoulder pain  Pain in left elbow     Problem List Patient Active Problem List   Diagnosis Date Noted  . Left Ryan pain 07/21/2018  . Dyspnea on exertion 08/05/2017  . Cyst of tendon sheath 05/11/2017  . GERD 11/21/2009  . Attention deficit hyperactivity disorder (ADHD) 06/10/2009   08/08/2009 PT, DPT, GCS  Donasia Wimes 05/24/2019, 9:59 AM   [facility] MAIN [facility] SERVICES [address] [place], [place], [place] Phone: [phone]   Fax:  [phone]  Name: Calvin Ryan MRN:  Calvin Ryan Date of Birth: 1980-06-16

## 2019-05-29 ENCOUNTER — Other Ambulatory Visit: Payer: Self-pay

## 2019-05-29 ENCOUNTER — Ambulatory Visit: Payer: PRIVATE HEALTH INSURANCE

## 2019-05-29 DIAGNOSIS — M25512 Pain in left shoulder: Secondary | ICD-10-CM

## 2019-05-29 DIAGNOSIS — G8929 Other chronic pain: Secondary | ICD-10-CM

## 2019-05-29 DIAGNOSIS — M25522 Pain in left elbow: Secondary | ICD-10-CM

## 2019-05-29 NOTE — Patient Instructions (Signed)
Access Code: [email or web address]  URL: [email or web address]  Date: 05/29/2019  Prepared by: Roxana Hires   Exercises Isometric Wrist Extension Pronated - 5 reps - 1 sets - 45-60s hold                            - 2x daily - 7x weekly Seated Isometric Wrist Radial Deviation with Manual Resistance - 5 reps - 1 sets - 45-60s hold - 2x daily - 7x weekly Standing Wrist Flexion Stretch - 3 reps - 45-60s hold - 3x daily - 7x weekly Seated Wrist Extension with Dumbbell - 10 reps - 2 sets - 6s hold - 2x daily - 7x weekly

## 2019-05-29 NOTE — Therapy (Signed)
[facility] MAIN [facility] SERVICES [address] [place], [place], [address] Phone: [phone]   Fax:  [phone]  Physical Therapy Treatment  Patient Details  Name: Calvin Ryan MRN: [id number] Date of Birth: Nov 03, 1980 Referring Provider (PT): Dr Donette Larry   Encounter Date: 05/29/2019  PT End of Session - 05/29/19 0913    Visit Number  3    Number of Visits  17    Date for PT Re-Evaluation  07/11/19    PT Start Time  0802    PT Stop Time  0845    PT Time Calculation (min)  43 min    Activity Tolerance  Patient tolerated treatment well    Behavior During Therapy  [facility] for tasks assessed/performed       Past Medical History:  Diagnosis Date  . ADHD (attention deficit hyperactivity disorder)     Past Surgical History:  Procedure Laterality Date  . HERNIA REPAIR      There were no vitals filed for this visit.  Subjective Assessment - 05/29/19 0912    Subjective  Pt reports he is doing well today. He denies any resting pain but continues to have pain with gripping and wrist extension. However his pain-free gripping strength appears to have improved. He peformed isometrics twice last week. Overall he reports approximately 40% improvement in L elbow since start of therapy. Otherwise no specific questions or concerns at this time.    Pertinent History  Pt complains of bilateral shoulder soreness over the course of multiple years. He was hiking approximately 3 years ago and fell backwards supporting himself on his L shoulder in an extended position. He saw a PT and introduced formal exercises with resolution over the period of a few weeks. Prior to COVID he was working out regularly and was improving his shoulder and upper quarter strength. He started having increased shoulder pain since he stopped working out and now reports pain-avoidant behavior with resistance training secondary to the pain. He was rock climbing with his kids in August 2020 and  went to point to a location and felt a sharp pain deep in his L shoulder. He reports that this pain is deeper than what he has experienced in the past. Pain is in the anterior shoulder. In addition in September he was building a climbing apparatus and torqung in a lot of bolts and started to have significant anterolateral L elbow pain. "It feels like it is right over my radial head." Pt reports that his L shoulder pain is improving but is still very limiting. 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 He has tried the Voltaren gel on the shoulder without significant improvement. "I wouldn't be surprised if I injured part of the labrum." Pt states that the elbow seems like typical epicondylagia. No previous or current neck pain.    Diagnostic tests  None for L shoulder and L elbow    Patient Stated Goals  Decrease L elbow pain with work related responsibilities, resolve L shoulder pain in order to be able to rock climb    Currently in Pain?  No/denies         TREATMENT   Trigger Point Dry Needling (TDN), unbilled Education performed with patient regarding potential benefit of TDN. Pt provided verbal consent to treatment. TDN performed toL  ECRBwith 2,0.30x 30 two concurrent needle placementsas well as threading technique to entire L lateral mass with 1, 0.30 x 60 single needle placement. In addition performed 2 placements to L bicep with 0.30 x 60 single needle placements. Local twitch response (LTR)with all placements distal to the elbow and with 1 placement in the bicep. Pistoning technique utilized. Post-needling soreness reported.   Manual Therapy MWM with belt assist to L elbow with lateral glide with forearm in pronation during gripping 4 x 15, pt reports pain-free gripping but muscle  fatigue. Elbow flexion position modified initially to find pain-free range; L radial head posterior to anterior mobilizations grade III, 20s/bout x 4 bouts; L radial head anterior to posterior mobilizations grade III, 20s/bout x 3 bouts; IASTM to L lateral mass extending distal down posterior forearm with trigger point release while moving elbow through pronation/supination and wrist flexion/extension ROM. Also performed IASTM to L bicep while moving L elbow through PROM flexion and extension; Mills manipulation to L radial head twice with a cavitation during each manipulation; Reviewed home isometric strengthening exercies as well as stretch for L forearm, emailed and texted Med[employer] HEP;    Pt reports post-needling soreness at end of session. Utilized IASTM and joint mobs during session today and also included L bicep due to reports of improvement with home treatment. Overall he is reporting approximately 40% improvement in L lateral elbow pain since starting therapy. Pt encouraged to continue current isometric strengthening, stretches, and gradually progress to eccentric and then concentric exercises when he is able to perform pain-free isometrics. Pt will benefit from PT services to address deficits in L elbow and L shoulder pain in order to return to full function at home and work without pain.                          PT Short Term Goals - 05/17/19 2027      PT SHORT TERM GOAL #1   Title  Pt will be independent with HEP in order to manage/decrease L shoulder and L elbow pain so he can resume full activity at home/work    Time  4    Period  Weeks    Status  New    Target Date  06/13/19        PT Long Term Goals - 05/17/19 2028      PT LONG TERM GOAL #1   Title  Pt will decrease quick DASH score by at least 8% in order to demonstrate clinically significant reduction in disability.    Baseline  05/16/19: 25%    Time  8    Period  Weeks    Status  New     Target Date  07/11/19      PT LONG TERM GOAL #2   Title  Pt will decrease worst pain as reported on NPRS by at least 3 points in order to demonstrate clinically significant reduction in L shoulder and L elbow pain    Baseline  05/16/19: shoulder: 4/10, elbow: 7/10    Time  8    Period  Weeks    Status  New    Target Date  07/11/19      PT LONG TERM GOAL #3   Title  Pt will improve pain-free L grip strength to within 5% of R grip strength in order to complete all work related activities without increase in pain while gripping    Baseline  05/16/19: R: 138#, L:  93#    Time  8    Period  Weeks    Status  New    Target Date  07/11/19      PT LONG TERM GOAL #4   Title  Pt will demonstrate improvement in his PRTEE by at least 11 points in order to demonstrate significant improvement in L elbow pain so he can resume pain-free function at home and work.    Baseline  05/16/19: 25/100    Time  8    Period  Weeks    Status  New    Target Date  07/11/19            Plan - 05/29/19 0906    Clinical Impression Statement  Pt reports post-needling soreness at end of session. Utilized IASTM and joint mobs during session today and also included L bicep due to reports of improvement with home treatment. Overall he is reporting approximately 40% improvement in L lateral elbow pain since starting therapy. Pt encouraged to continue current isometric strengthening, stretches, and gradually progress to eccentric and then concentric exercises when he is able to perform pain-free isometrics. Pt will benefit from PT services to address deficits in L elbow and L shoulder pain in order to return to full function at home and work without pain.    Personal Factors and Comorbidities  Time since onset of injury/illness/exacerbation;Profession    Examination-Activity Limitations  Other;Lift;Reach Overhead;Caring for Others    Examination-Participation Restrictions  Other   Work and leisure activities    Stability/Clinical Decision Making  Evolving/Moderate complexity    Rehab Potential  Good    PT Frequency  2x / week    PT Duration  8 weeks    PT Treatment/Interventions  Aquatic Therapy;Canalith Repostioning;Electrical Stimulation;Cryotherapy;Iontophoresis 4mg /ml Dexamethasone;Moist Heat;Traction;Ultrasound;Parrafin;Fluidtherapy;Contrast Bath;DME Instruction;Gait training;Stair training;Functional mobility training;Therapeutic activities;Therapeutic exercise;Balance training;Neuromuscular re-education;Patient/family education;Manual techniques;Dry needling;Splinting;Taping;Vestibular;Joint Manipulations;ADLs/Self Care Home Management;Passive range of motion;Spinal Manipulations    PT Next Visit Plan  Assess scapular mobility, MWM for L elbow, Mills manipulation, STM, TDN    PT Home Exercise Plan  Access Code: [email or web address]    Consulted and Agree with Plan of Care  Patient       Patient will benefit from skilled therapeutic intervention in order to improve the following deficits and impairments:  Pain, Decreased strength  Visit Diagnosis: Pain in left elbow  Chronic left shoulder pain     Problem List Patient Active Problem List   Diagnosis Date Noted  . Left knee pain 07/21/2018  . Dyspnea on exertion 08/05/2017  . Cyst of tendon sheath 05/11/2017  . GERD 11/21/2009  . Attention deficit hyperactivity disorder (ADHD) 06/10/2009   Phillips Grout PT, DPT, GCS  , 05/29/2019, 9:20 AM  [place] MAIN [facility] SERVICES [address] [place], [place], [address] Phone: [phone]   Fax:  [phone]  Name: Calvin Ryan MRN: [id number] Date of Birth: [birth year]

## 2019-06-04 ENCOUNTER — Ambulatory Visit: Payer: PRIVATE HEALTH INSURANCE | Admitting: Psychology

## 2019-06-18 ENCOUNTER — Ambulatory Visit: Payer: PRIVATE HEALTH INSURANCE

## 2019-06-19 ENCOUNTER — Ambulatory Visit: Payer: PRIVATE HEALTH INSURANCE | Attending: Family Medicine

## 2019-06-19 ENCOUNTER — Other Ambulatory Visit: Payer: Self-pay

## 2019-06-19 DIAGNOSIS — M25512 Pain in left shoulder: Secondary | ICD-10-CM | POA: Insufficient documentation

## 2019-06-19 DIAGNOSIS — M25522 Pain in left elbow: Secondary | ICD-10-CM | POA: Insufficient documentation

## 2019-06-19 DIAGNOSIS — G8929 Other chronic pain: Secondary | ICD-10-CM | POA: Insufficient documentation

## 2019-06-19 NOTE — Therapy (Signed)
[facility] MAIN [facility] SERVICES [address] [place], [place], [address] Phone: [phone]   Fax:  [phone]  Physical Therapy Treatment  Patient Details  Name: Calvin Ryan MRN: [id number] Date of Birth: 10-17-1980 Referring Provider (PT): Dr Donette Larry   Encounter Date: 06/19/2019  PT End of Session - 06/19/19 0810    Visit Number  4    Number of Visits  17    Date for PT Re-Evaluation  07/11/19    PT Start Time  0805    PT Stop Time  0855    PT Time Calculation (min)  50 min    Activity Tolerance  Patient tolerated treatment well    Behavior During Therapy  [facility] for tasks assessed/performed       Past Medical History:  Diagnosis Date  . ADHD (attention deficit hyperactivity disorder)     Past Surgical History:  Procedure Laterality Date  . HERNIA REPAIR      There were no vitals filed for this visit.  Subjective Assessment - 06/19/19 0808    Subjective  Pt reports he is doing well today. He reports that his elbow is approximately 75% improved since his initial evaluation. He would like to focus today's session on this L shoulder. He has been performing a lot of isometrics for his L elbow. Otherwise no specific questions or concerns at this time.    Pertinent History  Pt complains of bilateral shoulder soreness over the course of multiple years. He was hiking approximately 3 years ago and fell backwards supporting himself on his L shoulder in an extended position. He saw a PT and introduced formal exercises with resolution over the period of a few weeks. Prior to COVID he was working out regularly and was improving his shoulder and upper quarter strength. He started having increased shoulder pain since he stopped working out and now reports pain-avoidant behavior with resistance training secondary to the pain. He was rock climbing with his kids in August 2020 and went to point to a location and felt a sharp pain deep in his L shoulder.  He reports that this pain is deeper than what he has experienced in the past. Pain is in the anterior shoulder. In addition in September he was building a climbing apparatus and torqung in a lot of bolts and started to have significant anterolateral L elbow pain. "It feels like it is right over my radial head." Pt reports that his L shoulder pain is improving but is still very limiting. 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 He has tried the Voltaren gel on the shoulder without significant improvement. "I wouldn't be surprised if I injured part of the labrum." Pt states that the elbow seems like typical epicondylagia. No previous or current neck pain.    Diagnostic tests  None for L shoulder and L elbow    Patient Stated Goals  Decrease L elbow pain with work related responsibilities, resolve L shoulder pain in order to be able to rock climb    Currently in Pain?  No/denies           TREATMENT   Manual Therapy Reassessment of L shoulder performed: End range L shoulder pain, worsened with overpressure and scapular blocking (Neer test), for L shoulder flexion, scaption, and abduction (flexion is  the worst); Pain with resisted L shoulder flexion in thumb up but no pain reported with palm up and thumb down; Pain and weakness with L shoulder empty can position in scaption;  Full L shoulder HBH and HBB with mild pain during HBB but strong and painless resisted IR; Mild pain with Biceps load II test but no pain with biceps load I test.  Pain and mild weakness with resisted L shoulder ER at neutral, no pain with resisted IR; Extensive palpation performed with mild pain over L AC joint but good mobility in all directions. No pain with [place] joint mobs or palpation. No concordant pain with L pec major,  minor, lat, teres major, subscapularis palpation. Pt does have significant pain and reproduction of symptoms with palpation to infraspinatus at both distal and proximal portions; L GH AP mobilizations, grade I-II, 20s/bout x 4 bouts; L GH inferior mobilizations, grade I-II, 20s/bout x 3 bouts, pt does report some "neural tension" type symptoms during inferior mobilizations; L shoulder ULTT A for median nerve negative for reproduction of concordant pain reproduction; STM to L infraspinatus at both distal and proximal portions with long hold trigger point release. Included PROM IR/ER during trigger point release; Reviewed home isometric strengthening for L shoulder at neutral gradually progressing through pain-free ranges toward end range;   Trigger Point Dry Needling (TDN), unbilled Education performed with patient regarding potential benefit of TDN. Pt provided verbal consent to treatment. TDN performed toL distal infraspinatus/posterior deltoid with1,0.25x 60 needle placement followed by 1, 0.30 x 60 needle placement. Additional TDN performed to more proximal muscle belly of L infraspinatus with scapula as backdrop. Utilized 3, 0.30 x 60 single needle placements with significant ache reported. Pistoning technique utilized. Post-needling soreness but improvement in pain free L shoulder flexion and scaption reported.    Limited re-assessment performed to L shoulder with most significant findings of pain at end range L shoulder flexion, scaption, and abduction as well as pain with infraspinatus muscle testing as well as painful palpation. TDN performed toL distal infraspinatus/posterior deltoid as well as more proximal muscle belly. Significant ache reported as well as post-needling soreness however improvement in pain free L shoulder flexion and scaption noted. Also utilized STM to L infraspinatus at both distal and proximal portions with long hold trigger point release. Included PROM L shoulder  IR/ER during trigger point release. Pt encouraged to perform isometric strengthening for L shoulder at neutral gradually progressing through pain-free ranges toward end range. Will continue to focus on L shoulder as L elbow continues to improve. Pt will benefit from PT services to address deficits inL elbow and L shoulder painin order to return to full function at home and work without pain.                      PT Short Term Goals - 05/17/19 2027      PT SHORT TERM GOAL #1   Title  Pt will be independent with HEP in order to manage/decrease L shoulder and L elbow pain so he can resume full activity at home/work    Time  4    Period  Weeks    Status  New    Target Date  06/13/19        PT Long Term Goals - 05/17/19 2028      PT LONG TERM GOAL #1   Title  Pt will decrease quick DASH score by at least 8% in order to demonstrate clinically significant reduction  in disability.    Baseline  05/16/19: 25%    Time  8    Period  Weeks    Status  New    Target Date  07/11/19      PT LONG TERM GOAL #2   Title  Pt will decrease worst pain as reported on NPRS by at least 3 points in order to demonstrate clinically significant reduction in L shoulder and L elbow pain    Baseline  05/16/19: shoulder: 4/10, elbow: 7/10    Time  8    Period  Weeks    Status  New    Target Date  07/11/19      PT LONG TERM GOAL #3   Title  Pt will improve pain-free L grip strength to within 5% of R grip strength in order to complete all work related activities without increase in pain while gripping    Baseline  05/16/19: R: 138#, L: 93#    Time  8    Period  Weeks    Status  New    Target Date  07/11/19      PT LONG TERM GOAL #4   Title  Pt will demonstrate improvement in his PRTEE by at least 11 points in order to demonstrate significant improvement in L elbow pain so he can resume pain-free function at home and work.    Baseline  05/16/19: 25/100    Time  8    Period  Weeks    Status   New    Target Date  07/11/19            Plan - 06/19/19 0160    Clinical Impression Statement  Limited re-assessment performed to L shoulder with most significant findings of pain at end range L shoulder flexion, scaption, and abduction as well as pain with infraspinatus muscle testing as well as painful palpation. TDN performed to L distal infraspinatus/posterior deltoid as well as more proximal muscle belly. Significant ache reported as well as post-needling soreness however improvement in pain free L shoulder flexion and scaption noted. Also utilized STM to L infraspinatus at both distal and proximal portions with long hold trigger point release. Included PROM L shoulder IR/ER during trigger point release. Pt encouraged to perform isometric strengthening for L shoulder at neutral gradually progressing through pain-free ranges toward end range. Will continue to focus on L shoulder as L elbow continues to improve. Pt will benefit from PT services to address deficits in L elbow and L shoulder pain in order to return to full function at home and work without pain.    Personal Factors and Comorbidities  Time since onset of injury/illness/exacerbation;Profession    Examination-Activity Limitations  Other;Lift;Reach Overhead;Caring for Others    Examination-Participation Restrictions  Other   Work and leisure activities   Stability/Clinical Decision Making  Evolving/Moderate complexity    Rehab Potential  Good    PT Frequency  2x / week    PT Duration  8 weeks    PT Treatment/Interventions  Aquatic Therapy;Canalith Repostioning;Electrical Stimulation;Cryotherapy;Iontophoresis 4mg /ml Dexamethasone;Moist Heat;Traction;Ultrasound;Parrafin;Fluidtherapy;Contrast Bath;DME Instruction;Gait training;Stair training;Functional mobility training;Therapeutic activities;Therapeutic exercise;Balance training;Neuromuscular re-education;Patient/family education;Manual techniques;Dry  needling;Splinting;Taping;Vestibular;Joint Manipulations;ADLs/Self Care Home Management;Passive range of motion;Spinal Manipulations    PT Next Visit Plan  Assess scapular mobility, MWM for L elbow, Mills manipulation, STM, TDN to L elbow and L shoulder as needed (focus on infraspinatus), Progress L shoulder isometric strengthening as appropriate    PT Home Exercise Plan  Access Code: [email or web address]    Consulted and  Agree with Plan of Care  Patient       Patient will benefit from skilled therapeutic intervention in order to improve the following deficits and impairments:  Pain, Decreased strength  Visit Diagnosis: Pain in left elbow  Chronic left shoulder pain     Problem List Patient Active Problem List   Diagnosis Date Noted  . Left knee pain 07/21/2018  . Dyspnea on exertion 08/05/2017  . Cyst of tendon sheath 05/11/2017  . GERD 11/21/2009  . Attention deficit hyperactivity disorder (ADHD) 06/10/2009   Phillips Grout PT, DPT, GCS  Huprich,Jason 06/19/2019, 9:52 AM  [place] MAIN [facility] SERVICES [address] [place], [place], [address] Phone: [phone]   Fax:  [phone]  Name: NIKOLOZ HUY MRN: [id number] Date of Birth: 1981/01/16

## 2019-06-25 ENCOUNTER — Other Ambulatory Visit: Payer: Self-pay

## 2019-06-25 ENCOUNTER — Ambulatory Visit: Payer: PRIVATE HEALTH INSURANCE

## 2019-06-25 DIAGNOSIS — M25522 Pain in left elbow: Secondary | ICD-10-CM | POA: Diagnosis not present

## 2019-06-25 DIAGNOSIS — G8929 Other chronic pain: Secondary | ICD-10-CM

## 2019-06-25 DIAGNOSIS — M25512 Pain in left shoulder: Secondary | ICD-10-CM

## 2019-06-25 NOTE — Therapy (Signed)
[facility] MAIN [facility] SERVICES [address] [place], [place], [address] Phone: [phone]   Fax:  [phone]  Physical Therapy Treatment  Patient Details  Name: Calvin Ryan MRN: [id number] Date of Birth: Apr 06, 1981 Referring Provider (PT): Dr Donette Larry   Encounter Date: 06/25/2019  PT End of Session - 06/25/19 1318    Visit Number  5    Number of Visits  17    Date for PT Re-Evaluation  07/11/19    PT Start Time  0817    PT Stop Time  0845    PT Time Calculation (min)  28 min    Activity Tolerance  Patient tolerated treatment well    Behavior During Therapy  [facility] for tasks assessed/performed       Past Medical History:  Diagnosis Date  . ADHD (attention deficit hyperactivity disorder)     Past Surgical History:  Procedure Laterality Date  . HERNIA REPAIR      There were no vitals filed for this visit.  Subjective Assessment - 06/25/19 1315    Subjective  Pt reports he is doing well today. He reports that his elbow pain is significantly improving and he has been able to increase his grip strength isometrics. Overall his shoulder has been improved but is still more limiting than his elbow pain. Otherwise no changes to his health and no specific questions or concerns at this time.    Pertinent History  Pt complains of bilateral shoulder soreness over the course of multiple years. He was hiking approximately 3 years ago and fell backwards supporting himself on his L shoulder in an extended position. He saw a PT and introduced formal exercises with resolution over the period of a few weeks. Prior to COVID he was working out regularly and was improving his shoulder and upper quarter strength. He started having increased shoulder pain since he stopped working out and now reports pain-avoidant behavior with resistance training secondary to the pain. He was rock climbing with his kids in August 2020 and went to [place] to [place] location and felt a  sharp pain deep in his L shoulder. He reports that this pain is deeper than what he has experienced in the past. Pain is in the anterior shoulder. In addition in September he was building a climbing apparatus and torqung in a lot of bolts and started to have significant anterolateral L elbow pain. "It feels like it is right over my radial head." Pt reports that his L shoulder pain is improving but is still very limiting. 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 He has tried the Voltaren gel on the shoulder without significant improvement. "I wouldn't be surprised if I injured part of the labrum." Pt states that the elbow seems like typical epicondylagia. No previous or current neck pain.    Diagnostic tests  None for L shoulder and L elbow    Patient Stated Goals  Decrease L elbow pain with work related responsibilities, resolve L shoulder pain in order to be able to rock climb    Currently in Pain?  No/denies         TREATMENT   Manual Therapy L GH AP mobilizations, grade III, 20s/bout x 4 bouts, humeral head appears slightly more anterior today; L AC clavical on acriomion inferior mobilizations,  grade II, 20s/bout x 2 bouts; L GH inferior mobilizations at 90 abduction, grade II-III, 20s/bout x 3 bouts, progressing through more PROM abduction using pain as guide to limit range. Pt reports increasing pain as ROM progresses above 90 degrees; L shoulder ULTT A for median nerve x 10 reps; R sidelying L scapular upward rotation and posterior tiliting mobilizations grade II-III, 20s/bout x 3 bouts each; STM to L infraspinatus at both distal and proximal portions with long hold trigger point release. Included PROM IR/ER during trigger point release. Included STM to posterior deltoid as well as rhomboid  major/minor;   Trigger Point Dry Needling (TDN), unbilled Pt provided verbal consent to treatment. TDN performed toL subscapularis with2,0.30x 50 single needle placements, 1 more superior and the other more inferior. Lift-off and pistoning technique utilized with scapular winging. Pt reports soreness/ache during needling and reproduction of concordant pain especially with more superior placement. Improvement in pain free L shoulder flexion and scaption reported after needling.     Pt arrived late so session was abbreviated accordingly. His L humeral head appears slightly more anterior today during supine AP mobs and pt reports some increase in L AC joint soreness today. Only minor increase in L shoulder pain with STM however he does report concordant pain with TDN to L subscapularis with posterior approach using scapular winging/lift-off technique. Improvement in pain free L shoulder flexion and scaption reported after needling today. Will continue to focus on L shoulder as L elbow continues to improve. Pt will benefit from PT services to address deficits inL elbow and L shoulder painin order to return to full function at home and work without pain.                        PT Short Term Goals - 05/17/19 2027      PT SHORT TERM GOAL #1   Title  Pt will be independent with HEP in order to manage/decrease L shoulder and L elbow pain so he can resume full activity at home/work    Time  4    Period  Weeks    Status  New    Target Date  06/13/19        PT Long Term Goals - 05/17/19 2028      PT LONG TERM GOAL #1   Title  Pt will decrease quick DASH score by at least 8% in order to demonstrate clinically significant reduction in disability.    Baseline  05/16/19: 25%    Time  8    Period  Weeks    Status  New    Target Date  07/11/19      PT LONG TERM GOAL #2   Title  Pt will decrease worst pain as reported on NPRS by at least 3 points in order to demonstrate  clinically significant reduction in L shoulder and L elbow pain    Baseline  05/16/19: shoulder: 4/10, elbow: 7/10    Time  8    Period  Weeks    Status  New    Target Date  07/11/19      PT LONG TERM GOAL #3   Title  Pt will improve pain-free L grip strength to within 5% of R grip strength in order to complete all work related activities without increase in pain while gripping    Baseline  05/16/19: R: 138#, L: 93#    Time  8    Period  Weeks  Status  New    Target Date  07/11/19      PT LONG TERM GOAL #4   Title  Pt will demonstrate improvement in his PRTEE by at least 11 points in order to demonstrate significant improvement in L elbow pain so he can resume pain-free function at home and work.    Baseline  05/16/19: 25/100    Time  8    Period  Weeks    Status  New    Target Date  07/11/19            Plan - 06/25/19 1321    Clinical Impression Statement  Pt arrived late so session was abbreviated accordingly. His L humeral head appears slightly more anterior today during supine AP mobs and pt reports some increase in L AC joint soreness today. Only minor increase in L shoulder pain with STM however he does report concordant pain with TDN to L subscapularis with posterior approach using scapular winging/lift-off technique. Improvement in pain free L shoulder flexion and scaption reported after needling today. Will continue to focus on L shoulder as L elbow continues to improve. Pt will benefit from PT services to address deficits in L elbow and L shoulder pain in order to return to full function at home and work without pain.    Personal Factors and Comorbidities  Time since onset of injury/illness/exacerbation;Profession    Examination-Activity Limitations  Other;Lift;Reach Overhead;Caring for Others    Examination-Participation Restrictions  Other   Work and leisure activities   Stability/Clinical Decision Making  Evolving/Moderate complexity    Rehab Potential  Good    PT  Frequency  2x / week    PT Duration  8 weeks    PT Treatment/Interventions  Aquatic Therapy;Canalith Repostioning;Electrical Stimulation;Cryotherapy;Iontophoresis 4mg /ml Dexamethasone;Moist Heat;Traction;Ultrasound;Parrafin;Fluidtherapy;Contrast Bath;DME Instruction;Gait training;Stair training;Functional mobility training;Therapeutic activities;Therapeutic exercise;Balance training;Neuromuscular re-education;Patient/family education;Manual techniques;Dry needling;Splinting;Taping;Vestibular;Joint Manipulations;ADLs/Self Care Home Management;Passive range of motion;Spinal Manipulations    PT Next Visit Plan  MWM for L elbow, Mills manipulation, STM, TDN to L elbow and L shoulder as needed (focus on infraspinatus), Progress L shoulder isometric strengthening as appropriate    PT Home Exercise Plan  Access Code: [email or web address]    Consulted and Agree with Plan of Care  Patient       Patient will benefit from skilled therapeutic intervention in order to improve the following deficits and impairments:  Pain, Decreased strength  Visit Diagnosis: Chronic left shoulder pain     Problem List Patient Active Problem List   Diagnosis Date Noted  . Left knee pain 07/21/2018  . Dyspnea on exertion 08/05/2017  . Cyst of tendon sheath 05/11/2017  . GERD 11/21/2009  . Attention deficit hyperactivity disorder (ADHD) 06/10/2009   Calvin Ryan PT, DPT, GCS  Calvin Ryan 06/25/2019, 1:43 PM  [place] MAIN [facility] SERVICES [address] [place], [place], [address] Phone: [phone]   Fax:  [phone]  Name: Calvin Ryan MRN: [id number] Date of Birth: [birth year]

## 2019-07-02 ENCOUNTER — Ambulatory Visit: Payer: PRIVATE HEALTH INSURANCE

## 2019-07-02 ENCOUNTER — Other Ambulatory Visit: Payer: Self-pay

## 2019-07-02 DIAGNOSIS — M25522 Pain in left elbow: Secondary | ICD-10-CM | POA: Diagnosis not present

## 2019-07-02 NOTE — Therapy (Signed)
[place] MAIN [facility] SERVICES [address] [place], [place], [address] Phone: [phone]   Fax:  [phone]  Physical Therapy Treatment/Recertification  Patient Details  Name: Calvin Ryan MRN: [id number] Date of Birth: 06/25/1980 Referring Provider (PT): Dr Lysle Rubens   Encounter Date: 07/02/2019  PT End of Session - 07/02/19 1655    Visit Number  6    Number of Visits  33    Date for PT Re-Evaluation  08/28/19    PT Start Time  1645    PT Stop Time  1730    PT Time Calculation (min)  45 min    Activity Tolerance  Patient tolerated treatment well    Behavior During Therapy  [facility] for tasks assessed/performed       Past Medical History:  Diagnosis Date  . ADHD (attention deficit hyperactivity disorder)     Past Surgical History:  Procedure Laterality Date  . HERNIA REPAIR      There were no vitals filed for this visit.  Subjective Assessment - 07/02/19 1648    Subjective  Pt reports he is doing well today. He reports that his shoulder has been doing pretty well. He hasn't had any more AC joint swelling or pain over the last couple days. He would like to focus on his elbow today. Otherwise no changes to his health and no specific questions or concerns at this time.    Pertinent History  Pt complains of bilateral shoulder soreness over the course of multiple years. He was hiking approximately 3 years ago and fell backwards supporting himself on his L shoulder in an extended position. He saw a PT and introduced formal exercises with resolution over the period of a few weeks. Prior to [place] he was working out regularly and was improving his shoulder and upper quarter strength. He started having increased shoulder pain since he stopped working out and now reports pain-avoidant behavior with resistance training secondary to the pain. He was rock climbing with his kids in August 2020 and went to [place] to [place] location and felt a sharp pain deep in  his L shoulder. He reports that this pain is deeper than what he has experienced in the past. Pain is in the anterior shoulder. In addition in September he was building a climbing apparatus and torqung in a lot of bolts and started to have significant anterolateral L elbow pain. "It feels like it is right over my radial head." Pt reports that his L shoulder pain is improving but is still very limiting. 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 He has tried the Voltaren gel on the shoulder without significant improvement. "I wouldn't be surprised if I injured part of the labrum." Pt states that the elbow seems like typical epicondylagia. No previous or current neck pain.    Diagnostic tests  None for L shoulder and L elbow    Patient Stated Goals  Decrease L elbow pain with work related responsibilities, resolve L shoulder pain in order to be able to rock climb    Currently in Pain?  No/denies          TREATMENT   Trigger Point Dry Needling (TDN), unbilled Education performed with patient regarding potential benefit of TDN. Pt provided verbal consent to treatment. TDN performed toL ECRB with2,0.25x  30 single needle placementsusing flat palpation technique as well as threading technique to entire L lateral mass with1, 0.30 x 50 single needle placement. Local twitch response (LTR)withall placements. Pistoning technique utilized. No post-needling soreness reported.   Manual Therapy Pt completed QuickDASH: 13.6% and PRTEE: 15/100 Goals updated with patient with pt reporting worst elbow pain as 4/10 and shoulder pain as 2/10; Pain-free grip strength measurement: L: 104# (maximal effort and no pain reported); MWMwith belt assistto L elbow with sustained lateral glidewith forearm in  pronationduring gripping 3s hold 3 x 10, pt reports pain-free gripping but muscle fatigue. L radial head posterior to anterior mobilizations grade III, 20s/bout x 4 bouts; L radial head anterior to posterior mobilizations grade III, 20s/bout x 4 bouts; L radial head distraction with arm stabilized proximal to elbow and force applied to distal radius near wrist, grade III, 20s/bout x 3 bouts; L ulnar distraction mobilizations with elbow flexed at 90 grade III, 20s/bout x 2 bouts; STM to L lateral massextending distal down posterior forearmwith trigger point release while moving elbow through pronation/supination and wrist flexion/extension ROM; Mills manipulation to L radial head cavitation;   Ther-ex  Supine L wrist extension with straight elbow with 2# weighted ball 2 x 10;   Pt reports significant improvement in L shoulder and elbow pain since starting therapy. His worst shoulder pain has decreased from a 4/10 to a 2/10 and his worst elbow pain has decreased from a 7/10 to a 4/10. His QuickDASH has improved from 25% to 15% and his PRTEE has improved from 25/100 to 15/100. His pain-free L grip strength has improved from 93# to 104#. Pt would like to focus today's session on elbow pain. Utilized STM and TDN to L elbow with positive response by patient. Also initiated light concentric L wrist extension strengthening with weight ball for gripping to decreased activation of ECRB. Pt encouraged to continue current isometric strengthening, stretches, and gradually progress to eccentric and then concentric strengthening.Pt will continue to benefit from PT services to address deficits inL elbow and L shoulder painin order to return to full function at home and work without pain.                         PT Short Term Goals - 07/02/19 1654      PT SHORT TERM GOAL #1   Title  Pt will be independent with HEP in order to manage/decrease L shoulder and L elbow pain so he can  resume full activity at home/work    Time  4    Period  Weeks    Status  On-going    Target Date  07/30/19        PT Long Term Goals - 07/02/19 1655      PT LONG TERM GOAL #1   Title  Pt will decrease quick DASH score by at least 8% in order to demonstrate clinically significant reduction in disability.    Baseline  05/16/19: 25%; 07/02/19: 13.6%    Time  8    Period  Weeks    Status  Achieved    Target Date  08/27/19      PT LONG TERM GOAL #2   Title  Pt will decrease worst pain as reported on NPRS by at least 3 points in order to demonstrate clinically significant reduction in L shoulder and L elbow pain    Baseline  05/16/19: shoulder: 4/10, elbow: 7/10; 07/02/19: shoulder: 2/10, elbow: 4/10;  Time  8    Period  Weeks    Status  Partially Met    Target Date  08/27/19      PT LONG TERM GOAL #3   Title  Pt will improve pain-free L grip strength to within 5% of R grip strength in order to complete all work related activities without increase in pain while gripping    Baseline  05/16/19: R: 138#, L: 93#; 07/02/19: L: 104# (maximal effort without pain)    Time  8    Period  Weeks    Status  Partially Met    Target Date  08/27/19      PT LONG TERM GOAL #4   Title  Pt will demonstrate improvement in his PRTEE by at least 11 points in order to demonstrate significant improvement in L elbow pain so he can resume pain-free function at home and work.    Baseline  05/16/19: 25/100; 07/02/19: 15/100    Time  8    Period  Weeks    Status  Partially Met    Target Date  08/27/19            Plan - 07/02/19 1655    Clinical Impression Statement  Pt reports significant improvement in L shoulder and elbow pain since starting therapy. His worst shoulder pain has decreased from a 4/10 to a 2/10 and his worst elbow pain has decreased from a 7/10 to a 4/10. His QuickDASH has improved from 25% to 15% and his PRTEE has improved from 25/100 to 15/100. His pain-free L grip strength has improved  from 93# to 104#. Pt would like to focus today's session on elbow pain. Utilized STM and TDN to L elbow with positive response by patient. Also initiated light concentric L wrist extension strengthening with weight ball for gripping to decreased activation of ECRB. Pt encouraged to continue current isometric strengthening, stretches, and gradually progress to eccentric and then concentric strengthening. Pt will continue to benefit from PT services to address deficits in L elbow and L shoulder pain in order to return to full function at home and work without pain.    Personal Factors and Comorbidities  Time since onset of injury/illness/exacerbation;Profession    Examination-Activity Limitations  Other;Lift;Reach Overhead;Caring for Others    Examination-Participation Restrictions  Other   Work and leisure activities   Stability/Clinical Decision Making  Evolving/Moderate complexity    Rehab Potential  Good    PT Frequency  2x / week    PT Duration  8 weeks    PT Treatment/Interventions  Aquatic Therapy;Canalith Repostioning;Electrical Stimulation;Cryotherapy;Iontophoresis 28m/ml Dexamethasone;Moist Heat;Traction;Ultrasound;Parrafin;Fluidtherapy;Contrast Bath;DME Instruction;Gait training;Stair training;Functional mobility training;Therapeutic activities;Therapeutic exercise;Balance training;Neuromuscular re-education;Patient/family education;Manual techniques;Dry needling;Splinting;Taping;Vestibular;Joint Manipulations;ADLs/Self Care Home Management;Passive range of motion;Spinal Manipulations    PT Next Visit Plan  MWM for L elbow, Mills manipulation, STM, TDN to L elbow and L shoulder as needed (focus on infraspinatus), Progress L shoulder isometric strengthening as appropriate    PT Home Exercise Plan  Access Code: [email or web address]    Consulted and Agree with Plan of Care  Patient       Patient will benefit from skilled therapeutic intervention in order to improve the following deficits and impairments:   Pain, Decreased strength  Visit Diagnosis: Pain in left elbow     Problem List Patient Active Problem List   Diagnosis Date Noted  . Left knee pain 07/21/2018  . Dyspnea on exertion 08/05/2017  . Cyst of tendon sheath 05/11/2017  . GERD 11/21/2009  . Attention  deficit hyperactivity disorder (ADHD) 06/10/2009   Phillips Grout PT, DPT, GCS  Calvin Ryan 07/03/2019, 8:32 AM  [place] MAIN [facility] SERVICES [address] [place], [place], [address] Phone: [phone]   Fax:  [phone]  Name: Calvin Ryan MRN: [id number] Date of Birth: 25-Jul-1980

## 2019-07-11 ENCOUNTER — Ambulatory Visit: Payer: PRIVATE HEALTH INSURANCE | Attending: Family Medicine

## 2019-07-11 ENCOUNTER — Other Ambulatory Visit: Payer: Self-pay

## 2019-07-11 DIAGNOSIS — M25522 Pain in left elbow: Secondary | ICD-10-CM | POA: Insufficient documentation

## 2019-07-11 DIAGNOSIS — G8929 Other chronic pain: Secondary | ICD-10-CM | POA: Diagnosis present

## 2019-07-11 DIAGNOSIS — M25512 Pain in left shoulder: Secondary | ICD-10-CM | POA: Diagnosis present

## 2019-07-12 NOTE — Therapy (Signed)
[place] MAIN [facility] SERVICES [address] [place], [place], [address] Phone: [phone]   Fax:  [phone]  Physical Therapy Treatment  Patient Details  Name: Calvin Ryan MRN: [id number] Date of Birth: [birth year] Referring Provider (PT): Dr Lysle Rubens   Encounter Date: 07/11/2019  PT End of Session - 07/12/19 1401    Visit Number  7    Number of Visits  33    Date for PT Re-Evaluation  08/28/19    PT Start Time  5456    PT Stop Time  1645    PT Time Calculation (min)  35 min    Activity Tolerance  Patient tolerated treatment well    Behavior During Therapy  [facility] for tasks assessed/performed       Past Medical History:  Diagnosis Date  . ADHD (attention deficit hyperactivity disorder)     Past Surgical History:  Procedure Laterality Date  . HERNIA REPAIR      There were no vitals filed for this visit.  Subjective Assessment - 07/12/19 1357    Subjective  Pt reports he is doing well today. He is not having any resting L elbow or shoulder pain upon arrival. Overall he feels like he is improving and has been able to progress his L shoulder flexion and abduction strengthening to higher weights. He would like to focus on his elbow today but would like some dry needling for his L shoulder. Otherwise no changes to his health and no specific questions or concerns at this time.    Pertinent History  Pt complains of bilateral shoulder soreness over the course of multiple years. He was hiking approximately 3 years ago and fell backwards supporting himself on his L shoulder in an extended position. He saw a PT and introduced formal exercises with resolution over the period of a few weeks. Prior to [place] he was working out regularly and was improving his shoulder and upper quarter strength. He started having increased shoulder pain since he stopped working out and now reports pain-avoidant behavior with resistance training secondary to the pain. He  was rock climbing with his kids in August 2020 [place] and felt a sharp pain deep in his L shoulder. He reports that this pain is deeper than what he has experienced in the past. Pain is in the anterior shoulder. In addition in September he was building a climbing apparatus and torqung in a lot of bolts and started to have significant anterolateral L elbow pain. "It feels like it is right over my radial head." Pt reports that his L shoulder pain is improving but is still very limiting. 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 He has tried the Voltaren gel on the shoulder without significant improvement. "I wouldn't be surprised if I injured part of the labrum." Pt states that the elbow seems like typical epicondylagia. No previous or current neck pain.    Diagnostic tests  None for L shoulder and L elbow    Patient Stated Goals  Decrease L elbow pain with work related responsibilities, resolve L shoulder pain in order to be able to rock climb    Currently in Pain?  No/denies         TREATMENT   Trigger Point Dry Needling (TDN), unbilled  Pt provided verbal consent to treatment. TDN performed toLsubscapularis with2,0.30x60single needle placements, 1 more superior and the other more inferior. Prone positioning with scapular lift-off/winging and pistoning technique utilized. TDN performed to L anterior deltoid with 2, 0.30 x 50 single needle placements and L lateral deltoid with 1, 0.30 x 50 single needle placement. During one anterior deltoid placement pt with small bleeding after needle removed with therapist providing pressure and blood stopping quickly. However bruising starts at site shortly after needling. No twitch responses observed during session but pt reports deep  ache during all placements.    Manual Therapy MWMwith belt assistto L elbow with sustained lateral glidewith forearm in pronationduring gripping3s hold 3 x 10, pt reports pain-free gripping but muscle fatigue. L radialheadposteriorto anteriormobilizations grade III, 20s/bout x 4 bouts; L radialheadanterior to posteriormobilizations grade III, 20s/bout x 4bouts; L radial head distraction with arm stabilized proximal to elbow and force applied to distal radius near wrist, grade III, 20s/bout x 3 bouts; L ulnar distraction mobilizations with elbow flexed at 90 grade III, 20s/bout x 2 bouts; STM to L lateral massextending distal down posterior forearmwith trigger point release while moving elbow through pronation/supination andwristflexion/extension ROM;   Pt arrived late so session was abbreviated slightly. He is reporting improvement in his L shoulder and L elbow pain and has been able to progress resistance at home with strengthening. Performed TDN to L subscapularis, anterior deltoid, and posterior deltoid today. Continued with L elbow manual therapy. Pt reports pain-free maximal L grip force during belt assisted lateral glides. Pt encouraged to continue HEP and follow-up as scheduled.Pt will benefit from PT services to address deficits inL elbow and L shoulder painin order to return to full function at home and work without pain.                        PT Short Term Goals - 07/02/19 1654      PT SHORT TERM GOAL #1   Title  Pt will be independent with HEP in order to manage/decrease L shoulder and L elbow pain so he can resume full activity at home/work    Time  4    Period  Weeks    Status  On-going    Target Date  07/30/19        PT Long Term Goals - 07/02/19 1655      PT LONG TERM GOAL #1   Title  Pt will decrease quick DASH score by at least 8% in order to demonstrate clinically significant reduction in disability.    Baseline  05/16/19:  25%; 07/02/19: 13.6%    Time  8    Period  Weeks    Status  Achieved    Target Date  08/27/19      PT LONG TERM GOAL #2   Title  Pt will decrease worst pain as reported on NPRS by at least 3 points in order to demonstrate clinically significant reduction in L shoulder and L elbow pain    Baseline  05/16/19: shoulder: 4/10, elbow: 7/10; 07/02/19: shoulder: 2/10, elbow: 4/10;    Time  8    Period  Weeks    Status  Partially Met    Target Date  08/27/19      PT LONG TERM GOAL #3   Title  Pt will improve pain-free L grip strength to within 5% of R grip strength in order to complete all work related activities without increase in pain while gripping  Baseline  05/16/19: R: 138#, L: 93#; 07/02/19: L: 104# (maximal effort without pain)    Time  8    Period  Weeks    Status  Partially Met    Target Date  08/27/19      PT LONG TERM GOAL #4   Title  Pt will demonstrate improvement in his PRTEE by at least 11 points in order to demonstrate significant improvement in L elbow pain so he can resume pain-free function at home and work.    Baseline  05/16/19: 25/100; 07/02/19: 15/100    Time  8    Period  Weeks    Status  Partially Met    Target Date  08/27/19            Plan - 07/12/19 1402    Clinical Impression Statement  Pt arrived late so session was abbreviated slightly. He is reporting improvement in his L shoulder and L elbow pain and has been able to progress resistance at home with strengthening. Performed TDN to L subscapularis, anterior deltoid, and posterior deltoid today. Continued with L elbow manual therapy. Pt reports pain-free maximal L grip force during belt assisted lateral glides. Pt encouraged to continue HEP and follow-up as scheduled. Pt will benefit from PT services to address deficits in L elbow and L shoulder pain in order to return to full function at home and work without pain    Personal Factors and Comorbidities  Time since onset of  injury/illness/exacerbation;Profession    Examination-Activity Limitations  Other;Lift;Reach Overhead;Caring for Others    Examination-Participation Restrictions  Other   Work and leisure activities   Stability/Clinical Decision Making  Evolving/Moderate complexity    Rehab Potential  Good    PT Frequency  2x / week    PT Duration  8 weeks    PT Treatment/Interventions  Aquatic Therapy;Canalith Repostioning;Electrical Stimulation;Cryotherapy;Iontophoresis 40m/ml Dexamethasone;Moist Heat;Traction;Ultrasound;Parrafin;Fluidtherapy;Contrast Bath;DME Instruction;Gait training;Stair training;Functional mobility training;Therapeutic activities;Therapeutic exercise;Balance training;Neuromuscular re-education;Patient/family education;Manual techniques;Dry needling;Splinting;Taping;Vestibular;Joint Manipulations;ADLs/Self Care Home Management;Passive range of motion;Spinal Manipulations    PT Next Visit Plan  MWM for L elbow, Mills manipulation, STM, TDN to L elbow and L shoulder as needed (focus on infraspinatus), Progress L shoulder isometric strengthening as appropriate    PT Home Exercise Plan  Access Code: [email or web address]    Consulted and Agree with Plan of Care  Patient       Patient will benefit from skilled therapeutic intervention in order to improve the following deficits and impairments:  Pain, Decreased strength  Visit Diagnosis: Pain in left elbow  Chronic left shoulder pain     Problem List Patient Active Problem List   Diagnosis Date Noted  . Left knee pain 07/21/2018  . Dyspnea on exertion 08/05/2017  . Cyst of tendon sheath 05/11/2017  . GERD 11/21/2009  . Attention deficit hyperactivity disorder (ADHD) 06/10/2009   JPhillips GroutPT, DPT, GCS  Cheyanne Lamison 07/12/2019, 2:19 PM  [place] [facility] [address] [address]Phone: [phone]  Fax:  [phone] Name: Calvin POREMRN: [id number] of  Birth: 608-Feb-1982

## 2019-07-19 ENCOUNTER — Ambulatory Visit: Payer: PRIVATE HEALTH INSURANCE

## 2019-07-31 ENCOUNTER — Ambulatory Visit: Payer: PRIVATE HEALTH INSURANCE

## 2019-07-31 ENCOUNTER — Other Ambulatory Visit: Payer: Self-pay

## 2019-08-03 ENCOUNTER — Ambulatory Visit: Payer: PRIVATE HEALTH INSURANCE

## 2019-08-03 ENCOUNTER — Other Ambulatory Visit: Payer: Self-pay

## 2019-08-03 DIAGNOSIS — G8929 Other chronic pain: Secondary | ICD-10-CM

## 2019-08-03 DIAGNOSIS — M25512 Pain in left shoulder: Secondary | ICD-10-CM

## 2019-08-03 DIAGNOSIS — M25522 Pain in left elbow: Secondary | ICD-10-CM | POA: Diagnosis not present

## 2019-08-03 NOTE — Therapy (Signed)
[place] MAIN [facility] SERVICES [address] [place], [place], [address] Phone: [phone]   Fax:  [phone]  Physical Therapy Treatment  Patient Details  Name: Calvin Ryan MRN: [id number] Date of Birth: 10-28-1980 Referring Provider (PT): Dr Lysle Rubens   Encounter Date: 08/03/2019  PT End of Session - 08/03/19 1045    Visit Number  8    Number of Visits  33    Date for PT Re-Evaluation  08/28/19    PT Start Time  1045    PT Stop Time  1130    PT Time Calculation (min)  45 min    Activity Tolerance  Patient tolerated treatment well    Behavior During Therapy  [facility] for tasks assessed/performed       Past Medical History:  Diagnosis Date  . ADHD (attention deficit hyperactivity disorder)     Past Surgical History:  Procedure Laterality Date  . HERNIA REPAIR      There were no vitals filed for this visit.  Subjective Assessment - 08/03/19 1044    Subjective  Pt reports he is doing well today. His L elbow and L shoulder pain have been improving. At this time he feels like his shoulder is more limited by strength than pain but he does have some anterior L shoulder/chest pulling with LUE extension as well as end range flexion.    Pertinent History  Pt complains of bilateral shoulder soreness over the course of multiple years. He was hiking approximately 3 years ago and fell backwards supporting himself on his L shoulder in an extended position. He saw a PT and introduced formal exercises with resolution over the period of a few weeks. Prior to [name] he was working out regularly and was improving his shoulder and upper quarter strength. He started having increased shoulder pain since he stopped working out and now reports pain-avoidant behavior with resistance training secondary to the pain. He was rock climbing with his kids in August 2020 and went to point to a location and felt a sharp pain deep in his L shoulder. He reports that this pain is  deeper than what he has experienced in the past. Pain is in the anterior shoulder. In addition in September he was building a climbing apparatus and torqung in a lot of bolts and started to have significant anterolateral L elbow pain. "It feels like it is right over my radial head." Pt reports that his L shoulder pain is improving but is still very limiting. 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 He has tried the Voltaren gel on the shoulder without significant improvement. "I wouldn't be surprised if I injured part of the labrum." Pt states that the elbow seems like typical epicondylagia. No previous or current neck pain.    Diagnostic tests  None for L shoulder and L elbow    Patient Stated Goals  Decrease L elbow pain with work related responsibilities, resolve L shoulder pain in order to be able to rock climb    Currently in Pain?  No/denies         TREATMENT   Manual Therapy L GH AP mobilizations, grade III, 20s/bout x 3 bouts; L GH inferior mobilizations at 90 abduction, grade II-III, 20s/bout x 3 bouts; Prone STM to Linfraspinatus at both distal and  proximal portions with long hold trigger point release. Included PROM IR/ER during trigger point release. Prone STM to L posterior deltoid; Supine STM to L anterior deltoid with increased focus on pec minor with long hold trigger point release while moving LUE through PROM horizontal abduction and flexion, pt reporting some L shoulder pain toward end range flexion;   Trigger Point Dry Needling (TDN), unbilled Pt provided verbal consent to treatment. TDN performed toLpec major clavicular attachments at site of pain using pull away technique and needling parallel to body to stay far from lung fields. Utilize single 0.25 x 30 needle  initially but suboptimal response so switched to 0.30 x 30 needle for second placement. Proceeded with 2 additional placement in L anterior deltoid with 2, 0.30 x 50 single needle placements. Significant LTR with one out of two placements. Prone needling performed to infraspinatus with 3, 0.30x60single needle placements. First needle closer to proximal origin with each successive placement moving more distal toward insertion. First two placements both hit scapula as backdrop. Third placement is lateral to shoulder blade. Intermittent ache and LTR with all three placements. Improvement in pain free L shoulder AROM after needling.    Significant time spend during session performing dry needling which was unbilled. Session also focused on L shoulder STM and mobilizations. Pt with significant twitch responses during needling as well as improvement in pain-free L shoulder AROM following. Overall he is making excellent progress. Education performed at end of session discussing L shoulder ER strengthening.Pt will benefit from PT services to address deficits inL elbow and L shoulder painin order to return to full function at home and work without pain.                        PT Short Term Goals - 07/02/19 1654      PT SHORT TERM GOAL #1   Title  Pt will be independent with HEP in order to manage/decrease L shoulder and L elbow pain so he can resume full activity at home/work    Time  4    Period  Weeks    Status  On-going    Target Date  07/30/19        PT Long Term Goals - 07/02/19 1655      PT LONG TERM GOAL #1   Title  Pt will decrease quick DASH score by at least 8% in order to demonstrate clinically significant reduction in disability.    Baseline  05/16/19: 25%; 07/02/19: 13.6%    Time  8    Period  Weeks    Status  Achieved    Target Date  08/27/19      PT LONG TERM GOAL #2   Title  Pt will decrease worst pain as reported on NPRS by at least 3 points in order  to demonstrate clinically significant reduction in L shoulder and L elbow pain    Baseline  05/16/19: shoulder: 4/10, elbow: 7/10; 07/02/19: shoulder: 2/10, elbow: 4/10;    Time  8    Period  Weeks    Status  Partially Met    Target Date  08/27/19      PT LONG TERM GOAL #3   Title  Pt will improve pain-free L grip strength to within 5% of R grip strength in order to complete all work related activities without increase in pain while gripping    Baseline  05/16/19: R: 138#, L: 93#; 07/02/19: L: 104# (maximal effort without pain)  Time  8    Period  Weeks    Status  Partially Met    Target Date  08/27/19      PT LONG TERM GOAL #4   Title  Pt will demonstrate improvement in his PRTEE by at least 11 points in order to demonstrate significant improvement in L elbow pain so he can resume pain-free function at home and work.    Baseline  05/16/19: 25/100; 07/02/19: 15/100    Time  8    Period  Weeks    Status  Partially Met    Target Date  08/27/19            Plan - 08/03/19 1045    Clinical Impression Statement  Significant time spend during session performing dry needling which was unbilled. Session also focused on L shoulder STM and mobilizations. Pt with significant twitch responses during needling as well as improvement in pain-free L shoulder AROM following. Overall he is making excellent progress. Education performed at end of session discussing L shoulder ER strengthening. Pt will benefit from PT services to address deficits in L elbow and L shoulder pain in order to return to full function at home and work without pain.    Personal Factors and Comorbidities  Time since onset of injury/illness/exacerbation;Profession    Examination-Activity Limitations  Other;Lift;Reach Overhead;Caring for Others    Examination-Participation Restrictions  Other   Work and leisure activities   Stability/Clinical Decision Making  Evolving/Moderate complexity    Rehab Potential  Good    PT Frequency   2x / week    PT Duration  8 weeks    PT Treatment/Interventions  Aquatic Therapy;Canalith Repostioning;Electrical Stimulation;Cryotherapy;Iontophoresis 24m/ml Dexamethasone;Moist Heat;Traction;Ultrasound;Parrafin;Fluidtherapy;Contrast Bath;DME Instruction;Gait training;Stair training;Functional mobility training;Therapeutic activities;Therapeutic exercise;Balance training;Neuromuscular re-education;Patient/family education;Manual techniques;Dry needling;Splinting;Taping;Vestibular;Joint Manipulations;ADLs/Self Care Home Management;Passive range of motion;Spinal Manipulations    PT Next Visit Plan  MWM for L elbow, Mills manipulation, STM, TDN to L elbow and L shoulder as needed (focus on infraspinatus), Progress L shoulder isometric strengthening as appropriate    PT Home Exercise Plan  Access Code: [email or web address]    Consulted and Agree with Plan of Care  Patient       Patient will benefit from skilled therapeutic intervention in order to improve the following deficits and impairments:  Pain, Decreased strength  Visit Diagnosis: Chronic left shoulder pain     Problem List Patient Active Problem List   Diagnosis Date Noted  . Left knee pain 07/21/2018  . Dyspnea on exertion 08/05/2017  . Cyst of tendon sheath 05/11/2017  . GERD 11/21/2009  . Attention deficit hyperactivity disorder (ADHD) 06/10/2009   JPhillips GroutPT, DPT, GCS  Gene Glazebrook 08/03/2019, 3:17 PM  CWall[facility]MAIN [facility] LLCSERVICES [address] [address]Phone: [phone]  Fax:  [phone] Name: Calvin WEEKESMRN: [id number] of Birth: 612/02/1981

## 2019-08-14 ENCOUNTER — Telehealth: Payer: Self-pay | Admitting: *Deleted

## 2019-08-14 DIAGNOSIS — R42 Dizziness and giddiness: Secondary | ICD-10-CM

## 2019-08-14 NOTE — Telephone Encounter (Signed)
-----   Message from Lars Masson, MD sent at 08/13/2019  8:40 AM EST ----- Rhett Bannister,  Could you please add this patient ( a friend of mine) to my schedule on 3/24? He will require an exercise stress test prior to that. Dg: exertional dizziness, lightheadedness. He will also require lab work right after the GXT - CMP, CBC, lipids, HbA1c.  Thank you,  Aris Lot

## 2019-08-14 NOTE — Telephone Encounter (Signed)
Message received from Dr. Delton See this morning that this pt prefers to wait on all test advised, and OV add on for 3/24 with Dr. Delton See.  Cancelled all orders  Will refer to this as needed.

## 2019-09-24 ENCOUNTER — Ambulatory Visit (INDEPENDENT_AMBULATORY_CARE_PROVIDER_SITE_OTHER): Payer: PRIVATE HEALTH INSURANCE | Admitting: Family Medicine

## 2019-09-24 ENCOUNTER — Ambulatory Visit: Payer: Self-pay

## 2019-09-24 ENCOUNTER — Other Ambulatory Visit: Payer: Self-pay

## 2019-09-24 ENCOUNTER — Encounter: Payer: Self-pay | Admitting: Family Medicine

## 2019-09-24 DIAGNOSIS — M545 Low back pain, unspecified: Secondary | ICD-10-CM

## 2019-09-24 DIAGNOSIS — G8929 Other chronic pain: Secondary | ICD-10-CM

## 2019-09-24 DIAGNOSIS — M25551 Pain in right hip: Secondary | ICD-10-CM | POA: Diagnosis not present

## 2019-09-24 NOTE — Progress Notes (Signed)
Office Visit Note   Patient: Calvin Ryan           Date of Birth: [birth year]           MRN: [id number] Visit Date: 09/24/2019 Requested by: Georgann Housekeeper, MD [address] [place],  [place] [address] PCP: Georgann Housekeeper, MD  Subjective: Chief Complaint  Patient presents with  . Right Hip - Pain    Has had problems over the last 15 years with running, "that have been manageable." The pain has evolved though and now has pain all the time.  . Lower Back - Pain    HPI: He is here with right low back and hip pain.  Over the past 15 years he has had intermittent pain usually in the posterior hip.  Generally it would occur at the end of very long runs and was very manageable.  In the last month or 2, he has been having pain almost daily.  He is still running quite a bit.  He gets some pain in the anterior hip, some pain in the posterior hip.  Recently he tried taking anti-inflammatories more consistently and it helped.  He has occasional popping in his hip.               ROS:   All other systems were reviewed and are negative.  Objective: Vital Signs: There were no vitals taken for this visit.  Physical Exam:  General:  Alert and oriented, in no acute distress. Pulm:  Breathing unlabored. Psy:  Normal mood, congruent affect. Skin: No rash Low back: No scoliosis, good flexibility.  Straight leg raise negative, lower extremity strength and reflexes are normal.  He is slightly tender near the right greater than left SI joint, and the right gluteus medius area. Right hip: He has internal rotation of about 30 degrees compared to 45 degrees on the left.  External rotation is symmetric and normal bilaterally.  He has pain with passive flexion and internal rotation on the right.  No significant tenderness over the greater trochanter.   Imaging: X-rays lumbar spine: Disc spaces well-preserved.  Possibly some early DJD in the SI joints.  X-rays right hip: Small cam deformity  on the right.  Joint space well preserved.  Slightly shallow acetabulum on both sides.  No sign of stress fracture.    Assessment & Plan: 1.  Chronic right hip pain with possible impingement, possible labrum pathology.  He seems to have developed sacroiliac dysfunction as well.  Cannot rule out lumbar disc protrusion. -He will rest for a few weeks.  If symptoms persist, then possibly MRI scan of the hip to look for stress fracture versus MRI of the lumbar spine to look for disc protrusion. -Could contemplate dextrose prolotherapy to the hip if no stress fracture seen.     Procedures: No procedures performed  No notes on file     PMFS History: Patient Active Problem List   Diagnosis Date Noted  . Left knee pain 07/21/2018  . Dyspnea on exertion 08/05/2017  . Cyst of tendon sheath 05/11/2017  . GERD 11/21/2009  . Attention deficit hyperactivity disorder (ADHD) 06/10/2009   Past Medical History:  Diagnosis Date  . ADHD (attention deficit hyperactivity disorder)     Family History  Problem Relation Age of Onset  . Hyperlipidemia Mother   . Hypertension Mother   . Hyperlipidemia Father   . Hypertension Father     Past Surgical History:  Procedure Laterality Date  .  HERNIA REPAIR     Social History   Occupational History  . Not on file  Tobacco Use  . Smoking status: Former Research scientist (life sciences)  . Smokeless tobacco: Never Used  Substance and Sexual Activity  . Alcohol use: Yes  . Drug use: Not on file  . Sexual activity: Not on file

## 2023-01-12 ENCOUNTER — Other Ambulatory Visit (HOSPITAL_COMMUNITY): Payer: Self-pay

## 2023-01-27 ENCOUNTER — Other Ambulatory Visit (HOSPITAL_BASED_OUTPATIENT_CLINIC_OR_DEPARTMENT_OTHER): Payer: Self-pay

## 2023-01-27 ENCOUNTER — Other Ambulatory Visit (HOSPITAL_COMMUNITY): Payer: Self-pay

## 2023-02-04 ENCOUNTER — Other Ambulatory Visit (HOSPITAL_BASED_OUTPATIENT_CLINIC_OR_DEPARTMENT_OTHER): Payer: Self-pay

## 2023-02-04 MED ORDER — AMPHETAMINE-DEXTROAMPHETAMINE 20 MG PO TABS
20.0000 mg | ORAL_TABLET | Freq: Two times a day (BID) | ORAL | 0 refills | Status: DC
  Filled 2023-02-04: qty 60, 30d supply, fill #0

## 2023-02-25 ENCOUNTER — Other Ambulatory Visit: Payer: Self-pay

## 2023-02-25 MED ORDER — FLULAVAL 0.5 ML IM SUSY
0.5000 mL | PREFILLED_SYRINGE | INTRAMUSCULAR | 0 refills | Status: AC
  Filled 2023-02-25: qty 0.5, 1d supply, fill #0

## 2023-03-07 ENCOUNTER — Other Ambulatory Visit: Payer: Self-pay

## 2023-03-07 MED ORDER — AMPHETAMINE-DEXTROAMPHETAMINE 20 MG PO TABS
20.0000 mg | ORAL_TABLET | Freq: Two times a day (BID) | ORAL | 0 refills | Status: DC
  Filled 2023-03-08: qty 60, 30d supply, fill #0

## 2023-03-08 ENCOUNTER — Other Ambulatory Visit: Payer: Self-pay

## 2023-04-08 ENCOUNTER — Other Ambulatory Visit (HOSPITAL_COMMUNITY): Payer: Self-pay

## 2023-04-08 MED ORDER — AMPHETAMINE-DEXTROAMPHETAMINE 20 MG PO TABS
20.0000 mg | ORAL_TABLET | Freq: Two times a day (BID) | ORAL | 0 refills | Status: DC
  Filled 2023-04-08: qty 60, 30d supply, fill #0

## 2023-05-09 ENCOUNTER — Other Ambulatory Visit: Payer: Self-pay

## 2023-05-09 MED ORDER — AMPHETAMINE-DEXTROAMPHETAMINE 20 MG PO TABS
20.0000 mg | ORAL_TABLET | Freq: Two times a day (BID) | ORAL | 0 refills | Status: DC
  Filled 2023-05-09: qty 60, 30d supply, fill #0

## 2023-06-09 ENCOUNTER — Other Ambulatory Visit: Payer: Self-pay

## 2023-06-09 MED ORDER — AMPHETAMINE-DEXTROAMPHETAMINE 20 MG PO TABS
20.0000 mg | ORAL_TABLET | Freq: Two times a day (BID) | ORAL | 0 refills | Status: DC
  Filled 2023-06-09: qty 60, 30d supply, fill #0

## 2023-07-11 ENCOUNTER — Other Ambulatory Visit: Payer: Self-pay

## 2023-07-11 MED ORDER — AMPHETAMINE-DEXTROAMPHETAMINE 20 MG PO TABS
20.0000 mg | ORAL_TABLET | Freq: Two times a day (BID) | ORAL | 0 refills | Status: DC
  Filled 2023-07-11: qty 60, 30d supply, fill #0

## 2023-08-17 ENCOUNTER — Other Ambulatory Visit: Payer: Self-pay

## 2023-08-17 MED ORDER — AMPHETAMINE-DEXTROAMPHETAMINE 20 MG PO TABS
20.0000 mg | ORAL_TABLET | Freq: Two times a day (BID) | ORAL | 0 refills | Status: DC
  Filled 2023-08-17: qty 60, 30d supply, fill #0

## 2023-09-15 ENCOUNTER — Other Ambulatory Visit: Payer: Self-pay

## 2023-09-15 MED ORDER — AMPHETAMINE-DEXTROAMPHETAMINE 20 MG PO TABS
20.0000 mg | ORAL_TABLET | Freq: Two times a day (BID) | ORAL | 0 refills | Status: DC
  Filled 2023-09-15: qty 60, 30d supply, fill #0

## 2023-09-29 DIAGNOSIS — E038 Other specified hypothyroidism: Secondary | ICD-10-CM | POA: Diagnosis not present

## 2023-09-29 DIAGNOSIS — Z1389 Encounter for screening for other disorder: Secondary | ICD-10-CM | POA: Diagnosis not present

## 2023-09-29 DIAGNOSIS — Z0000 Encounter for general adult medical examination without abnormal findings: Secondary | ICD-10-CM | POA: Diagnosis not present

## 2023-09-29 DIAGNOSIS — K219 Gastro-esophageal reflux disease without esophagitis: Secondary | ICD-10-CM | POA: Diagnosis not present

## 2023-09-29 DIAGNOSIS — F900 Attention-deficit hyperactivity disorder, predominantly inattentive type: Secondary | ICD-10-CM | POA: Diagnosis not present

## 2023-09-29 DIAGNOSIS — R7309 Other abnormal glucose: Secondary | ICD-10-CM | POA: Diagnosis not present

## 2023-10-18 ENCOUNTER — Other Ambulatory Visit: Payer: Self-pay

## 2023-10-18 MED ORDER — AMPHETAMINE-DEXTROAMPHETAMINE 20 MG PO TABS
20.0000 mg | ORAL_TABLET | Freq: Two times a day (BID) | ORAL | 0 refills | Status: DC
  Filled 2023-10-18: qty 60, 30d supply, fill #0

## 2023-11-21 ENCOUNTER — Other Ambulatory Visit: Payer: Self-pay

## 2023-11-21 MED ORDER — AMPHETAMINE-DEXTROAMPHETAMINE 20 MG PO TABS
20.0000 mg | ORAL_TABLET | Freq: Two times a day (BID) | ORAL | 0 refills | Status: DC
  Filled 2023-11-21: qty 60, 30d supply, fill #0

## 2023-12-20 ENCOUNTER — Other Ambulatory Visit: Payer: Self-pay

## 2023-12-20 MED ORDER — AMPHETAMINE-DEXTROAMPHETAMINE 20 MG PO TABS
20.0000 mg | ORAL_TABLET | Freq: Two times a day (BID) | ORAL | 0 refills | Status: DC
  Filled 2023-12-20: qty 60, 30d supply, fill #0

## 2024-01-20 ENCOUNTER — Other Ambulatory Visit: Payer: Self-pay

## 2024-01-20 MED ORDER — AMPHETAMINE-DEXTROAMPHETAMINE 20 MG PO TABS
20.0000 mg | ORAL_TABLET | Freq: Two times a day (BID) | ORAL | 0 refills | Status: DC
  Filled 2024-01-20: qty 60, 30d supply, fill #0

## 2024-01-23 ENCOUNTER — Other Ambulatory Visit (HOSPITAL_COMMUNITY): Payer: Self-pay

## 2024-02-27 ENCOUNTER — Other Ambulatory Visit: Payer: Self-pay

## 2024-02-27 MED ORDER — AMPHETAMINE-DEXTROAMPHETAMINE 20 MG PO TABS
20.0000 mg | ORAL_TABLET | Freq: Two times a day (BID) | ORAL | 0 refills | Status: DC
  Filled 2024-02-27: qty 60, 30d supply, fill #0

## 2024-03-29 ENCOUNTER — Other Ambulatory Visit: Payer: Self-pay

## 2024-03-29 MED ORDER — AMPHETAMINE-DEXTROAMPHETAMINE 20 MG PO TABS
20.0000 mg | ORAL_TABLET | Freq: Two times a day (BID) | ORAL | 0 refills | Status: DC
  Filled 2024-03-29: qty 60, 30d supply, fill #0

## 2024-03-30 ENCOUNTER — Other Ambulatory Visit: Payer: Self-pay

## 2024-04-23 ENCOUNTER — Other Ambulatory Visit: Payer: Self-pay

## 2024-04-23 MED ORDER — FLUTICASONE-SALMETEROL 250-50 MCG/ACT IN AEPB
1.0000 | INHALATION_SPRAY | Freq: Two times a day (BID) | RESPIRATORY_TRACT | 3 refills | Status: AC
  Filled 2024-04-23: qty 60, 30d supply, fill #0

## 2024-05-23 ENCOUNTER — Other Ambulatory Visit: Payer: Self-pay

## 2024-05-23 MED ORDER — AMPHETAMINE-DEXTROAMPHETAMINE 20 MG PO TABS
20.0000 mg | ORAL_TABLET | Freq: Two times a day (BID) | ORAL | 0 refills | Status: DC
  Filled 2024-05-23: qty 60, 30d supply, fill #0

## 2024-07-06 ENCOUNTER — Other Ambulatory Visit: Payer: Self-pay

## 2024-07-06 MED ORDER — AMPHETAMINE-DEXTROAMPHETAMINE 20 MG PO TABS
20.0000 mg | ORAL_TABLET | Freq: Two times a day (BID) | ORAL | 0 refills | Status: AC
  Filled 2024-07-06: qty 60, 30d supply, fill #0
